# Patient Record
Sex: Female | Born: 1960
Health system: Southern US, Community
[De-identification: ages and names within clinical notes are randomized; demographics above are authoritative.]

## PROBLEM LIST (undated history)

## (undated) DIAGNOSIS — Z9109 Other allergy status, other than to drugs and biological substances: Secondary | ICD-10-CM

## (undated) DIAGNOSIS — K589 Irritable bowel syndrome without diarrhea: Secondary | ICD-10-CM

## (undated) DIAGNOSIS — Z9889 Other specified postprocedural states: Secondary | ICD-10-CM

## (undated) DIAGNOSIS — N3010 Interstitial cystitis (chronic) without hematuria: Secondary | ICD-10-CM

## (undated) DIAGNOSIS — I372 Nonrheumatic pulmonary valve stenosis with insufficiency: Secondary | ICD-10-CM

## (undated) DIAGNOSIS — R112 Nausea with vomiting, unspecified: Secondary | ICD-10-CM

## (undated) DIAGNOSIS — K219 Gastro-esophageal reflux disease without esophagitis: Secondary | ICD-10-CM

## (undated) DIAGNOSIS — G35 Multiple sclerosis: Secondary | ICD-10-CM

## (undated) DIAGNOSIS — T7840XA Allergy, unspecified, initial encounter: Secondary | ICD-10-CM

## (undated) DIAGNOSIS — E785 Hyperlipidemia, unspecified: Secondary | ICD-10-CM

## (undated) DIAGNOSIS — G709 Myoneural disorder, unspecified: Secondary | ICD-10-CM

## (undated) DIAGNOSIS — M7070 Other bursitis of hip, unspecified hip: Secondary | ICD-10-CM

## (undated) DIAGNOSIS — M1990 Unspecified osteoarthritis, unspecified site: Secondary | ICD-10-CM

## (undated) HISTORY — PX: BREAST SURGERY: SHX581

## (undated) HISTORY — PX: UPPER GASTROINTESTINAL ENDOSCOPY: SHX188

## (undated) HISTORY — DX: Unspecified osteoarthritis, unspecified site: M19.90

## (undated) HISTORY — DX: Gastro-esophageal reflux disease without esophagitis: K21.9

## (undated) HISTORY — PX: AUGMENTATION MAMMAPLASTY: SUR837

## (undated) HISTORY — DX: Other bursitis of hip, unspecified hip: M70.70

## (undated) HISTORY — PX: LIPOSUCTION MULTIPLE BODY PARTS: SUR832

## (undated) HISTORY — DX: Allergy, unspecified, initial encounter: T78.40XA

## (undated) HISTORY — PX: ABDOMINAL HYSTERECTOMY: SHX81

## (undated) HISTORY — DX: Nausea with vomiting, unspecified: Z98.890

## (undated) HISTORY — DX: Nonrheumatic pulmonary valve stenosis with insufficiency: I37.2

## (undated) HISTORY — PX: POLYPECTOMY: SHX149

## (undated) HISTORY — PX: OTHER SURGICAL HISTORY: SHX169

## (undated) HISTORY — PX: COLONOSCOPY: SHX174

## (undated) HISTORY — PX: RHINOPLASTY: SUR1284

## (undated) HISTORY — PX: WISDOM TOOTH EXTRACTION: SHX21

## (undated) HISTORY — DX: Nausea with vomiting, unspecified: R11.2

## (undated) HISTORY — PX: TUBAL LIGATION: SHX77

---

## 2002-03-20 ENCOUNTER — Other Ambulatory Visit: Admission: RE | Admit: 2002-03-20 | Discharge: 2002-03-20 | Payer: Self-pay | Admitting: Family Medicine

## 2002-05-29 ENCOUNTER — Ambulatory Visit (HOSPITAL_COMMUNITY): Admission: RE | Admit: 2002-05-29 | Discharge: 2002-05-29 | Payer: Self-pay | Admitting: Neurology

## 2002-05-29 ENCOUNTER — Encounter: Payer: Self-pay | Admitting: Neurology

## 2004-07-15 ENCOUNTER — Other Ambulatory Visit: Admission: RE | Admit: 2004-07-15 | Discharge: 2004-07-15 | Payer: Self-pay | Admitting: Family Medicine

## 2006-07-13 ENCOUNTER — Other Ambulatory Visit: Admission: RE | Admit: 2006-07-13 | Discharge: 2006-07-13 | Payer: Self-pay | Admitting: Family Medicine

## 2007-11-21 ENCOUNTER — Other Ambulatory Visit: Admission: RE | Admit: 2007-11-21 | Discharge: 2007-11-21 | Payer: Self-pay | Admitting: Obstetrics and Gynecology

## 2007-12-14 HISTORY — PX: NOVASURE ABLATION: SHX5394

## 2007-12-14 HISTORY — PX: DILATION AND CURETTAGE OF UTERUS: SHX78

## 2007-12-20 ENCOUNTER — Encounter (INDEPENDENT_AMBULATORY_CARE_PROVIDER_SITE_OTHER): Payer: Self-pay | Admitting: Obstetrics and Gynecology

## 2007-12-20 ENCOUNTER — Ambulatory Visit (HOSPITAL_COMMUNITY): Admission: RE | Admit: 2007-12-20 | Discharge: 2007-12-20 | Payer: Self-pay | Admitting: Obstetrics and Gynecology

## 2008-11-30 ENCOUNTER — Encounter: Admission: RE | Admit: 2008-11-30 | Discharge: 2008-11-30 | Payer: Self-pay | Admitting: Family Medicine

## 2008-12-19 ENCOUNTER — Ambulatory Visit: Payer: Self-pay | Admitting: Gastroenterology

## 2009-01-02 ENCOUNTER — Ambulatory Visit: Payer: Self-pay | Admitting: Gastroenterology

## 2009-01-02 ENCOUNTER — Encounter: Payer: Self-pay | Admitting: Gastroenterology

## 2009-01-03 ENCOUNTER — Encounter: Payer: Self-pay | Admitting: Gastroenterology

## 2009-04-23 ENCOUNTER — Other Ambulatory Visit: Admission: RE | Admit: 2009-04-23 | Discharge: 2009-04-23 | Payer: Self-pay | Admitting: Obstetrics and Gynecology

## 2009-05-10 ENCOUNTER — Encounter: Admission: RE | Admit: 2009-05-10 | Discharge: 2009-05-10 | Payer: Self-pay | Admitting: Neurology

## 2010-04-02 ENCOUNTER — Encounter: Admission: RE | Admit: 2010-04-02 | Discharge: 2010-04-02 | Payer: Self-pay | Admitting: Family Medicine

## 2010-04-29 ENCOUNTER — Emergency Department (HOSPITAL_COMMUNITY): Admission: EM | Admit: 2010-04-29 | Discharge: 2010-04-29 | Payer: Self-pay | Admitting: Emergency Medicine

## 2010-05-02 ENCOUNTER — Emergency Department (HOSPITAL_COMMUNITY): Admission: EM | Admit: 2010-05-02 | Discharge: 2010-05-02 | Payer: Self-pay | Admitting: Family Medicine

## 2010-05-06 ENCOUNTER — Emergency Department (HOSPITAL_COMMUNITY): Admission: EM | Admit: 2010-05-06 | Discharge: 2010-05-06 | Payer: Self-pay | Admitting: Family Medicine

## 2010-05-13 ENCOUNTER — Emergency Department (HOSPITAL_COMMUNITY): Admission: EM | Admit: 2010-05-13 | Discharge: 2010-05-13 | Payer: Self-pay | Admitting: Family Medicine

## 2010-07-21 ENCOUNTER — Other Ambulatory Visit: Admission: RE | Admit: 2010-07-21 | Discharge: 2010-07-21 | Payer: Self-pay | Admitting: Family Medicine

## 2010-07-28 ENCOUNTER — Encounter: Admission: RE | Admit: 2010-07-28 | Discharge: 2010-07-28 | Payer: Self-pay | Admitting: Family Medicine

## 2010-09-25 ENCOUNTER — Encounter
Admission: RE | Admit: 2010-09-25 | Discharge: 2010-09-25 | Payer: Self-pay | Source: Home / Self Care | Attending: Orthopaedic Surgery | Admitting: Orthopaedic Surgery

## 2011-01-27 NOTE — Op Note (Signed)
Amber Singleton, Amber Singleton              ACCOUNT NO.:  [id number]   MEDICAL RECORD NO.:  [id number]          PATIENT TYPE:  AMB   LOCATION:  SDC                           FACILITY:  [facility]   PHYSICIAN:  Gerald Leitz, MD          DATE OF BIRTH:  Feb 02, 1961   DATE OF PROCEDURE:  12/20/2007  DATE OF DISCHARGE:                               OPERATIVE REPORT   PREOPERATIVE DIAGNOSIS:  Menorrhagia.   POSTOPERATIVE DIAGNOSIS:  Menorrhagia.  Endometrial polyp.   PROCEDURE:  Hysteroscopy, dilation and curettage, NovaSure endometrial  ablation.   SURGEON:  Gerald Leitz, MD   ASSISTANT:  None.   ANESTHESIA:  General.   FINDINGS:  Small endometrial polyp.   SPECIMEN:  Endometrial curettings.   DISPOSITION OF SPECIMEN:  Sent to pathology.   ESTIMATED BLOOD LOSS:  Minimal.   COMPLICATIONS:  None.   PROCEDURE:  The patient was taken to the operating room where she was  placed under general anesthesia.  She was placed in dorsal lithotomy  position, prepped and draped in the usual sterile fashion.  Bivalve  speculum was placed into the vaginal vault.  The anterior lip of the  cervix was grasped with a single-tooth tenaculum.  10 mL of 0.25%  Marcaine were injected at the 5 and 7 o'clock position of the cervix.  The uterus sounded to 9 cm.  Endocervical length was 3.5 cm for cavity  length of 5.5 cm. Hysteroscope was inserted and the cavity was noted to  have a small endometrial polyp, otherwise appeared normal. Hysteroscope  was removed.  The cervix was dilated to 8 mm.  Sharp curettage was  performed until a gritty texture was noted all the way around.  The  NovaSure apparatus was introduced into the cervix and uterine cavity  until the fundus was reached and the array was deployed.  The apparatus  was seated.  The cavity width was noted to be 4.7 cm.  The CO2 cavity  assessment test was performed. The NovaSure apparatus did not pass the  cavity assessment test. The NovaSure apparatus was removed.  Hysteroscope was then reinserted to assess for perforations.  No  perforations were noted.  NovaSure apparatus was reintroduced. The array  was deployed.  Cavity assessment test failed again.  The NovaSure was  removed from the uterus.  A new NovaSure apparatus was introduced to a  depth 9 cm.  The array was deployed. Cavity length was set at 5.5 cm.  The cavity width was 4.7 cm.  Cavity assessment test was performed and  this was passed.  The ablation was performed at a power of 142 for a  total of 1 minute 25 seconds.  NovaSure apparatus was removed from the  uterus.  The hysteroscope was reinserted.  The cavity was assessed.  No  evidence of perforation was noted.  The hysteroscope was removed.  The  single-tooth tenaculum was removed from the anterior lip of the cervix.  Bivalve  speculum was removed from the vagina.  Excellent hemostasis was noted.  The patient was awakened from anesthesia, taken to recovery  room awake  and in stable condition.  Please note that the distention medium deficit  was 70 mL.      Gerald Leitz, MD  Electronically Signed     TC/MEDQ  D:  12/20/2007  T:  12/20/2007  Job:  [id number]

## 2011-01-27 NOTE — H&P (Signed)
NAMEJAQUITA, Amber Singleton              ACCOUNT NO.:  [id number]   MEDICAL RECORD NO.:  [id number]          PATIENT TYPE:  AMB   LOCATION:  SDC                           [facility]ACILITY:  [facility]   PHYSICIAN:  Gerald Leitz, MD          DATE OF BIRTH:  October 30, 1960   DATE OF ADMISSION:  DATE OF DISCHARGE:                              HISTORY & PHYSICAL   The patient scheduled for surgery on December 20, 2007.   HISTORY OF PRESENT ILLNESS:  This is a 50 year old G2, P2 with  menorrhagia, who desires treatment with endometrial ablation.  She had  an endometrial biopsy performed on November 29, 2007 that was benign.  Ultrasound performed on November 29, 2007 shows that uterus measured 10.3 x  6.0 x 6.4 cm, with three small uterine fibroids noted, each less than 2-  cm.  The endometrium was thickened with a questionable hyperechoic mass  1.5 x 0.7 cm.  The patient's last menstrual period was November 24, 2007,  lasting 7 days.  She saturates a super tampon approximately every hour,  on her heaviest day.   PAST OBSTETRIC HISTORY:  Cesarean section x2.   GYNECOLOGICAL HISTORY:  Menarche at the age of 50.  Contraception:  Tubal  ligation.  Last Pap smear was November 21, 2007, and this was normal   PAST MEDICAL HISTORY:  Irritable bowel syndrome, interstitial cystitis  and multiple sclerosis.   PAST SURGICAL HISTORY:  Cesarean section x2, tubal ligation, breast  implantations, liposuction approximately 8 years ago, no surgery greater  than 25 years ago.   MEDICATIONS:  Benadryl as needed.   ALLERGIES:  PENICILLIN.   SOCIAL HISTORY:  The patient is married.  She denies tobacco use.  Occasional alcohol use.  No illicit drug use.   FAMILY HISTORY:  Mother with breast cancer.   REVIEW OF SYSTEMS:  Negative except as stated in history of current  illness.   PHYSICAL EXAM:  VITAL SIGNS:  Blood pressure 126/82, height 65-3/4  inches, weight 147 pounds.  CARDIOVASCULAR:  Regular rate and rhythm.  LUNGS:  Clear to  auscultation bilaterally.  ABDOMEN:  Soft, nontender, nondistended.  EXTREMITIES:  No clubbing, cyanosis or edema.  PELVIC:  Normal external female genitalia.  No vulvar or vaginal  cervical lesions noted.  BIMANUAL:  Exam reveals approximately a 10 to 12-week uterus.  No  adnexal masses or tenderness.   IMPRESSION AND PLAN:  A 50 year old with menorrhagia and small uterine  fibroids, questionable endometrial mass, desires therapy with  hysteroscopy D&C and endometrial ablation.  Risks, benefits,  alternatives of the surgery  was discussed with the patient, including but not limited to infection,  bleeding, possible uterine perforation, with the need for further  surgery.  The patient was understanding of risks and accepts these and  desires to proceed with hysteroscopy D&C and endometrial ablation.      Gerald Leitz, MD  Electronically Signed     TC/MEDQ  D:  12/19/2007  T:  12/19/2007  Job:  [id number]

## 2011-04-08 ENCOUNTER — Other Ambulatory Visit: Payer: Self-pay | Admitting: Family Medicine

## 2011-04-08 DIAGNOSIS — Z1231 Encounter for screening mammogram for malignant neoplasm of breast: Secondary | ICD-10-CM

## 2011-04-16 ENCOUNTER — Ambulatory Visit: Payer: Self-pay

## 2011-04-20 ENCOUNTER — Other Ambulatory Visit: Payer: Self-pay | Admitting: Family Medicine

## 2011-04-20 ENCOUNTER — Ambulatory Visit
Admission: RE | Admit: 2011-04-20 | Discharge: 2011-04-20 | Disposition: A | Discharge status: Home / Self Care | Payer: BC Managed Care – PPO | Source: Ambulatory Visit | Attending: Family Medicine | Admitting: Family Medicine

## 2011-04-20 DIAGNOSIS — Z1231 Encounter for screening mammogram for malignant neoplasm of breast: Secondary | ICD-10-CM

## 2011-06-09 LAB — CBC
HCT: 39.7
Hemoglobin: 13.6
RBC: 4.66
RDW: 15.5
WBC: 6.3

## 2011-06-09 LAB — URINALYSIS, ROUTINE W REFLEX MICROSCOPIC
Protein, ur: NEGATIVE
Specific Gravity, Urine: 1.01
Urobilinogen, UA: 0.2

## 2011-06-09 LAB — BASIC METABOLIC PANEL
CO2: 26
Chloride: 104
GFR calc Af Amer: >60
GFR calc non Af Amer: >60
Potassium: 3.6
Sodium: 137

## 2011-06-09 LAB — DIFFERENTIAL
Basophils Absolute: 0
Basophils Relative: 1
Lymphocytes Relative: 31
Monocytes Relative: 8
Neutrophils Relative %: 60

## 2011-06-09 LAB — URINE MICROSCOPIC-ADD ON

## 2011-10-06 ENCOUNTER — Encounter (HOSPITAL_COMMUNITY): Payer: Self-pay

## 2011-10-13 ENCOUNTER — Other Ambulatory Visit: Payer: Self-pay | Admitting: Obstetrics and Gynecology

## 2011-10-16 ENCOUNTER — Encounter (HOSPITAL_COMMUNITY)
Admission: RE | Admit: 2011-10-16 | Discharge: 2011-10-16 | Disposition: A | Discharge status: Home / Self Care | Payer: BC Managed Care – PPO | Source: Ambulatory Visit | Attending: Obstetrics and Gynecology | Admitting: Obstetrics and Gynecology

## 2011-10-16 ENCOUNTER — Encounter (HOSPITAL_COMMUNITY): Payer: Self-pay

## 2011-10-16 HISTORY — DX: Myoneural disorder, unspecified: G70.9

## 2011-10-16 HISTORY — DX: Irritable bowel syndrome, unspecified: K58.9

## 2011-10-16 HISTORY — DX: Hyperlipidemia, unspecified: E78.5

## 2011-10-16 HISTORY — DX: Multiple sclerosis: G35

## 2011-10-16 HISTORY — DX: Interstitial cystitis (chronic) without hematuria: N30.10

## 2011-10-16 HISTORY — DX: Other allergy status, other than to drugs and biological substances: Z91.09

## 2011-10-16 LAB — CBC
MCHC: 34.9 g/dL (ref 30.0–36.0)
Platelets: 237 10*3/uL (ref 150–400)
RDW: 12.7 % (ref 11.5–15.5)
WBC: 7.5 10*3/uL (ref 4.0–10.5)

## 2011-10-16 LAB — SURGICAL PCR SCREEN
MRSA, PCR: NEGATIVE
Staphylococcus aureus: NEGATIVE

## 2011-10-16 NOTE — Patient Instructions (Addendum)
   Your procedure is scheduled on: Friday, Feb 8th  Enter through the Main Entrance of [facility] at: 1130am Pick up the phone at the desk and dial [phone] and inform us of your arrival.  Please call this number if you have any problems the morning of surgery: [phone]  Remember: Do not eat food after midnight: Thursday Do not drink clear liquids after:  Thursday Take these medicines the morning of surgery with a SIP OF WATER: None  Do not wear jewelry, make-up, or FINGER nail polish Do not wear lotions, powders, perfumes or deodorant. Do not shave 48 hours prior to surgery. Do not bring valuables to the hospital.  Leave suitcase in the car. After Surgery it may be brought to your room. For patients being admitted to the hospital, checkout time is 11:00am the day of discharge. Home with Husband Cassondra Stachowski  cell [phone]  Patients discharged on the day of surgery will not be allowed to drive home.    Remember to use your hibiclens as instructed.Please shower with 1/2 bottle the evening before your surgery and the other 1/2 bottle the morning of surgery.

## 2011-10-22 MED ORDER — GENTAMICIN SULFATE 40 MG/ML IJ SOLN
INTRAVENOUS | Status: AC
  Administered 2011-10-23: 100 mL via INTRAVENOUS
  Filled 2011-10-22: qty 2.5

## 2011-10-22 NOTE — H&P (Signed)
Amber Singleton, Amber Singleton              ACCOUNT NO.:  [id number]  MEDICAL RECORD NO.:  [id number]  LOCATION:  PERIO                         FACILITY:  [facility]  PHYSICIAN:  Lenoard Aden, M.D.DATE OF BIRTH:  Oct 18, 1960  DATE OF ADMISSION:  09/02/2011 DATE OF DISCHARGE:                             HISTORY & PHYSICAL   CHIEF COMPLAINT:  Dysmenorrhea, pelvic pain with a history of failed endometrial ablation, symptomatic fibroids for definitive therapy.  HISTORY OF PRESENT ILLNESS:  She is a 51 year old white female, G2, P2, history of C-section x2, tubal ligation, who presents now for definitive treatment of dysmenorrhea, failed endometrial ablation, and symptomatic fibroids for definitive therapy.  ALLERGIES:  She has no known latex allergy.  She has drug allergy to PENICILLIN.  MEDICATIONS:  Ibuprofen as needed, and Gralise.  SURGICAL HISTORY:  Remarkable for tubal ligation, NovaSure ablation in 2009, D and C 2009, breast augmentation in 1996, tubal ligation in 1991, C-section x2.  FAMILY HISTORY:  Remarkable for mother with breast cancer, diabetes, heart disease, and lymphoma.  SOCIAL HISTORY:  She is a nonsmoker, nondrinker.  She denies domestic or physical violence.  MEDICAL HISTORY:  Also remarkable for history of MS and a history of Lyme disease.  PHYSICAL EXAMINATION:  GENERAL:  She is a well-developed, well-nourished white female. VITAL SIGNS:  Weight of 134 pounds, height of 54-1/2 inches. HEENT:  Normal. NECK:  Supple.  Full range of motion. LUNGS:  Clear. HEART:  Regular rate and rhythm. ABDOMEN:  Soft.  Uterus is bulky, irregular, and nontender to palpation. EXTREMITIES:  There are no cords. NEUROLOGIC:  Nonfocal. SKIN:  Intact.  IMPRESSION: 1. History of menorrhagia with failed endometrial ablation. 2. Previous cesarean section x2. 3. Symptomatic fibroids. 4. Strong family history of breast cancer in a first-degree relative.  PLAN:  Proceed with da Vinci  assisted total laparoscopic hysterectomy, BSO.  Risks of anesthesia, infection, bleeding, injury to abdominal organs, need for repair was discussed.  Delayed versus immediate complications to include bowel and bladder injury noted.  The patient acknowledges and wishes to proceed.     Lenoard Aden, M.D.     RJT/MEDQ  D:  10/22/2011  T:  10/22/2011  Job:  [id number]

## 2011-10-23 ENCOUNTER — Ambulatory Visit (HOSPITAL_COMMUNITY): Payer: BC Managed Care – PPO | Admitting: Anesthesiology

## 2011-10-23 ENCOUNTER — Encounter (HOSPITAL_COMMUNITY): Payer: Self-pay

## 2011-10-23 ENCOUNTER — Other Ambulatory Visit: Payer: Self-pay | Admitting: Obstetrics and Gynecology

## 2011-10-23 ENCOUNTER — Encounter (HOSPITAL_COMMUNITY): Payer: Self-pay | Admitting: Anesthesiology

## 2011-10-23 ENCOUNTER — Ambulatory Visit (HOSPITAL_COMMUNITY)
Admission: RE | Admit: 2011-10-23 | Discharge: 2011-10-24 | Disposition: A | Discharge status: Home / Self Care | Payer: BC Managed Care – PPO | Source: Ambulatory Visit | Attending: Obstetrics and Gynecology | Admitting: Obstetrics and Gynecology

## 2011-10-23 ENCOUNTER — Encounter (HOSPITAL_COMMUNITY)
Admission: RE | Disposition: A | Discharge status: Home / Self Care | Payer: Self-pay | Source: Ambulatory Visit | Attending: Obstetrics and Gynecology

## 2011-10-23 DIAGNOSIS — N801 Endometriosis of ovary: Secondary | ICD-10-CM | POA: Insufficient documentation

## 2011-10-23 DIAGNOSIS — N946 Dysmenorrhea, unspecified: Secondary | ICD-10-CM | POA: Insufficient documentation

## 2011-10-23 DIAGNOSIS — N838 Other noninflammatory disorders of ovary, fallopian tube and broad ligament: Secondary | ICD-10-CM | POA: Insufficient documentation

## 2011-10-23 DIAGNOSIS — N949 Unspecified condition associated with female genital organs and menstrual cycle: Secondary | ICD-10-CM | POA: Insufficient documentation

## 2011-10-23 DIAGNOSIS — N920 Excessive and frequent menstruation with regular cycle: Secondary | ICD-10-CM | POA: Insufficient documentation

## 2011-10-23 DIAGNOSIS — R1031 Right lower quadrant pain: Secondary | ICD-10-CM | POA: Insufficient documentation

## 2011-10-23 DIAGNOSIS — N80109 Endometriosis of ovary, unspecified side, unspecified depth: Secondary | ICD-10-CM | POA: Insufficient documentation

## 2011-10-23 DIAGNOSIS — Z90710 Acquired absence of both cervix and uterus: Secondary | ICD-10-CM | POA: Diagnosis not present

## 2011-10-23 SURGERY — ROBOTIC ASSISTED TOTAL HYSTERECTOMY
Anesthesia: General | Site: Abdomen | Wound class: Clean Contaminated

## 2011-10-23 MED ORDER — FENTANYL CITRATE 0.05 MG/ML IJ SOLN
INTRAMUSCULAR | Status: AC
  Filled 2011-10-23: qty 5

## 2011-10-23 MED ORDER — MIDAZOLAM HCL 2 MG/2ML IJ SOLN
INTRAMUSCULAR | Status: AC
  Filled 2011-10-23: qty 2

## 2011-10-23 MED ORDER — TRAMADOL HCL 50 MG PO TABS: 50.0000 mg | ORAL_TABLET | Freq: Four times a day (QID) | ORAL | Status: DC | PRN

## 2011-10-23 MED ORDER — HYDROMORPHONE 0.3 MG/ML IV SOLN
INTRAVENOUS | Status: AC
  Administered 2011-10-23: 7.5 mg via INTRAVENOUS
  Filled 2011-10-23: qty 25

## 2011-10-23 MED ORDER — DEXAMETHASONE SODIUM PHOSPHATE 4 MG/ML IJ SOLN
INTRAMUSCULAR | Status: DC | PRN
  Administered 2011-10-23: 10 mg via INTRAVENOUS

## 2011-10-23 MED ORDER — ONDANSETRON HCL 4 MG/2ML IJ SOLN
INTRAMUSCULAR | Status: DC | PRN
  Administered 2011-10-23: 4 mg via INTRAVENOUS

## 2011-10-23 MED ORDER — HYDROMORPHONE 0.3 MG/ML IV SOLN
INTRAVENOUS | Status: DC
  Administered 2011-10-23: 2.67 mg via INTRAVENOUS
  Administered 2011-10-23: 7.5 mg via INTRAVENOUS
  Administered 2011-10-24 (×2): 1.33 mg via INTRAVENOUS
  Administered 2011-10-24: 0.3 mg via INTRAVENOUS

## 2011-10-23 MED ORDER — SCOPOLAMINE 1 MG/3DAYS TD PT72
1.0000 | MEDICATED_PATCH | Freq: Once | TRANSDERMAL | Status: DC
  Administered 2011-10-23: 1.5 mg via TRANSDERMAL

## 2011-10-23 MED ORDER — DIPHENHYDRAMINE HCL 12.5 MG/5ML PO ELIX
12.5000 mg | ORAL_SOLUTION | Freq: Four times a day (QID) | ORAL | Status: DC | PRN
  Filled 2011-10-23: qty 5

## 2011-10-23 MED ORDER — SODIUM CHLORIDE 0.9 % IJ SOLN: 9.0000 mL | INTRAMUSCULAR | Status: DC | PRN

## 2011-10-23 MED ORDER — ONDANSETRON HCL 4 MG/2ML IJ SOLN: 4.0000 mg | Freq: Four times a day (QID) | INTRAMUSCULAR | Status: DC | PRN

## 2011-10-23 MED ORDER — FENTANYL CITRATE 0.05 MG/ML IJ SOLN
50.0000 ug | INTRAMUSCULAR | Status: AC | PRN
  Administered 2011-10-23 (×2): 50 ug via INTRAVENOUS

## 2011-10-23 MED ORDER — KETOROLAC TROMETHAMINE 30 MG/ML IJ SOLN: 15.0000 mg | Freq: Once | INTRAMUSCULAR | Status: DC | PRN

## 2011-10-23 MED ORDER — LIDOCAINE HCL (CARDIAC) 20 MG/ML IV SOLN
INTRAVENOUS | Status: DC | PRN
  Administered 2011-10-23: 50 mg via INTRAVENOUS

## 2011-10-23 MED ORDER — NALOXONE HCL 0.4 MG/ML IJ SOLN: 0.4000 mg | INTRAMUSCULAR | Status: DC | PRN

## 2011-10-23 MED ORDER — ROCURONIUM BROMIDE 50 MG/5ML IV SOLN
INTRAVENOUS | Status: AC
  Filled 2011-10-23: qty 1

## 2011-10-23 MED ORDER — OXYCODONE-ACETAMINOPHEN 5-325 MG PO TABS: 1.0000 | ORAL_TABLET | ORAL | Status: DC | PRN

## 2011-10-23 MED ORDER — DEXAMETHASONE SODIUM PHOSPHATE 10 MG/ML IJ SOLN
INTRAMUSCULAR | Status: AC
  Filled 2011-10-23: qty 1

## 2011-10-23 MED ORDER — LACTATED RINGERS IR SOLN
Status: DC | PRN
  Administered 2011-10-23: 3000 mL

## 2011-10-23 MED ORDER — FENTANYL CITRATE 0.05 MG/ML IJ SOLN
INTRAMUSCULAR | Status: AC
  Administered 2011-10-23: 50 ug via INTRAVENOUS
  Filled 2011-10-23: qty 2

## 2011-10-23 MED ORDER — PROMETHAZINE HCL 25 MG/ML IJ SOLN
INTRAMUSCULAR | Status: AC
  Filled 2011-10-23: qty 1

## 2011-10-23 MED ORDER — GLYCOPYRROLATE 0.2 MG/ML IJ SOLN
INTRAMUSCULAR | Status: DC | PRN
  Administered 2011-10-23: .4 mg via INTRAVENOUS
  Administered 2011-10-23: 0.2 mg via INTRAVENOUS

## 2011-10-23 MED ORDER — ZOLPIDEM TARTRATE 5 MG PO TABS: 5.0000 mg | ORAL_TABLET | Freq: Every evening | ORAL | Status: DC | PRN

## 2011-10-23 MED ORDER — PROMETHAZINE HCL 25 MG/ML IJ SOLN
6.2500 mg | INTRAMUSCULAR | Status: DC | PRN
  Administered 2011-10-23: 6.25 mg via INTRAVENOUS

## 2011-10-23 MED ORDER — PROPOFOL 10 MG/ML IV EMUL
INTRAVENOUS | Status: DC | PRN
  Administered 2011-10-23: 150 mg via INTRAVENOUS

## 2011-10-23 MED ORDER — ROCURONIUM BROMIDE 100 MG/10ML IV SOLN
INTRAVENOUS | Status: DC | PRN
  Administered 2011-10-23: 20 mg via INTRAVENOUS
  Administered 2011-10-23: 50 mg via INTRAVENOUS

## 2011-10-23 MED ORDER — PROPOFOL 10 MG/ML IV EMUL
INTRAVENOUS | Status: AC
  Filled 2011-10-23: qty 20

## 2011-10-23 MED ORDER — GLYCOPYRROLATE 0.2 MG/ML IJ SOLN
INTRAMUSCULAR | Status: AC
  Filled 2011-10-23: qty 1

## 2011-10-23 MED ORDER — LIDOCAINE HCL (CARDIAC) 20 MG/ML IV SOLN
INTRAVENOUS | Status: AC
  Filled 2011-10-23: qty 5

## 2011-10-23 MED ORDER — MEPERIDINE HCL 25 MG/ML IJ SOLN: 6.2500 mg | INTRAMUSCULAR | Status: DC | PRN

## 2011-10-23 MED ORDER — DEXTROSE-NACL 5-0.45 % IV SOLN
INTRAVENOUS | Status: DC
  Administered 2011-10-23 – 2011-10-24 (×2): via INTRAVENOUS

## 2011-10-23 MED ORDER — NEOSTIGMINE METHYLSULFATE 1 MG/ML IJ SOLN
INTRAMUSCULAR | Status: DC | PRN
  Administered 2011-10-23: 2 mg via INTRAVENOUS

## 2011-10-23 MED ORDER — FENTANYL CITRATE 0.05 MG/ML IJ SOLN
25.0000 ug | INTRAMUSCULAR | Status: DC | PRN
  Administered 2011-10-23 (×2): 25 ug via INTRAVENOUS

## 2011-10-23 MED ORDER — FENTANYL CITRATE 0.05 MG/ML IJ SOLN
INTRAMUSCULAR | Status: DC | PRN
  Administered 2011-10-23: 150 ug via INTRAVENOUS
  Administered 2011-10-23: 100 ug via INTRAVENOUS

## 2011-10-23 MED ORDER — BUPIVACAINE HCL (PF) 0.25 % IJ SOLN
INTRAMUSCULAR | Status: DC | PRN
  Administered 2011-10-23: 12 mL

## 2011-10-23 MED ORDER — KETOROLAC TROMETHAMINE 30 MG/ML IJ SOLN
INTRAMUSCULAR | Status: DC | PRN
  Administered 2011-10-23: 30 mg via INTRAMUSCULAR
  Administered 2011-10-23: 30 mg via INTRAVENOUS

## 2011-10-23 MED ORDER — NEOSTIGMINE METHYLSULFATE 1 MG/ML IJ SOLN
INTRAMUSCULAR | Status: AC
  Filled 2011-10-23: qty 10

## 2011-10-23 MED ORDER — BUPIVACAINE HCL (PF) 0.25 % IJ SOLN
INTRAMUSCULAR | Status: AC
  Filled 2011-10-23: qty 30

## 2011-10-23 MED ORDER — LACTATED RINGERS IV SOLN
INTRAVENOUS | Status: DC
  Administered 2011-10-23: 125 mL/h via INTRAVENOUS
  Administered 2011-10-23 (×2): via INTRAVENOUS

## 2011-10-23 MED ORDER — SCOPOLAMINE 1 MG/3DAYS TD PT72
MEDICATED_PATCH | TRANSDERMAL | Status: AC
  Administered 2011-10-23: 1.5 mg via TRANSDERMAL
  Filled 2011-10-23: qty 1

## 2011-10-23 MED ORDER — ONDANSETRON HCL 4 MG/2ML IJ SOLN
INTRAMUSCULAR | Status: AC
  Filled 2011-10-23: qty 2

## 2011-10-23 MED ORDER — FENTANYL CITRATE 0.05 MG/ML IJ SOLN
INTRAMUSCULAR | Status: AC
  Filled 2011-10-23: qty 2

## 2011-10-23 MED ORDER — DIPHENHYDRAMINE HCL 50 MG/ML IJ SOLN: 12.5000 mg | Freq: Four times a day (QID) | INTRAMUSCULAR | Status: DC | PRN

## 2011-10-23 MED ORDER — MIDAZOLAM HCL 5 MG/5ML IJ SOLN
INTRAMUSCULAR | Status: DC | PRN
  Administered 2011-10-23: 2 mg via INTRAVENOUS

## 2011-10-23 SURGICAL SUPPLY — 71 items
BAG URINE DRAINAGE (UROLOGICAL SUPPLIES) ×4 IMPLANT
BARRIER ADHS 3X4 INTERCEED (GAUZE/BANDAGES/DRESSINGS) IMPLANT
BLADELESS LONG 8MM (BLADE) IMPLANT
CABLE HIGH FREQUENCY MONO STRZ (ELECTRODE) ×4 IMPLANT
CATH FOLEY 3WAY  5CC 16FR (CATHETERS) ×1
CATH FOLEY 3WAY 5CC 16FR (CATHETERS) ×3 IMPLANT
CHLORAPREP W/TINT 26ML (MISCELLANEOUS) ×4 IMPLANT
CLOTH BEACON ORANGE TIMEOUT ST (SAFETY) ×4 IMPLANT
CONT PATH 16OZ SNAP LID 3702 (MISCELLANEOUS) ×4 IMPLANT
COVER MAYO STAND STRL (DRAPES) ×4 IMPLANT
COVER TABLE BACK 60X90 (DRAPES) ×8 IMPLANT
COVER TIP SHEARS 8 DVNC (MISCELLANEOUS) ×3 IMPLANT
COVER TIP SHEARS 8MM DA VINCI (MISCELLANEOUS) ×1
DECANTER SPIKE VIAL GLASS SM (MISCELLANEOUS) ×4 IMPLANT
DERMABOND ADVANCED (GAUZE/BANDAGES/DRESSINGS) ×1
DERMABOND ADVANCED .7 DNX12 (GAUZE/BANDAGES/DRESSINGS) ×3 IMPLANT
DRAPE HUG U DISPOSABLE (DRAPE) ×4 IMPLANT
DRAPE LG THREE QUARTER DISP (DRAPES) ×8 IMPLANT
DRAPE MONITOR DA VINCI (DRAPE) IMPLANT
DRAPE WARM FLUID 44X44 (DRAPE) ×4 IMPLANT
ELECT REM PT RETURN 9FT ADLT (ELECTROSURGICAL) ×4
ELECTRODE REM PT RTRN 9FT ADLT (ELECTROSURGICAL) ×3 IMPLANT
EVACUATOR SMOKE 8.L (FILTER) ×4 IMPLANT
GAUZE VASELINE 3X9 (GAUZE/BANDAGES/DRESSINGS) IMPLANT
GLOVE BIO SURGEON STRL SZ7.5 (GLOVE) ×20 IMPLANT
GLOVE BIOGEL PI IND STRL 6.5 (GLOVE) ×3 IMPLANT
GLOVE BIOGEL PI INDICATOR 6.5 (GLOVE) ×1
GLOVE ECLIPSE 6.0 STRL STRAW (GLOVE) ×4 IMPLANT
GOWN PREVENTION PLUS XLARGE (GOWN DISPOSABLE) ×8 IMPLANT
GOWN STRL REIN XL XLG (GOWN DISPOSABLE) ×28 IMPLANT
GYRUS RUMI II 2.5CM BLUE (DISPOSABLE) ×4
GYRUS RUMI II 3.5CM BLUE (DISPOSABLE)
GYRUS RUMI II 4.0CM BLUE (DISPOSABLE)
KIT ACCESSORY DA VINCI DISP (KITS) ×1
KIT ACCESSORY DVNC DISP (KITS) ×3 IMPLANT
KIT DISP ACCESSORY 4 ARM (KITS) IMPLANT
NEEDLE INSUFFLATION 14GA 120MM (NEEDLE) ×4 IMPLANT
PACK LAVH (CUSTOM PROCEDURE TRAY) ×4 IMPLANT
PAD PREP 24X48 CUFFED NSTRL (MISCELLANEOUS) ×8 IMPLANT
PLUG CATH AND CAP STER (CATHETERS) ×4 IMPLANT
POSITIONER SURGICAL ARM (MISCELLANEOUS) IMPLANT
PROTECTOR NERVE ULNAR (MISCELLANEOUS) ×12 IMPLANT
RUMI II 3.0CM BLUE KOH-EFFICIE (DISPOSABLE) IMPLANT
RUMI II GYRUS 2.5CM BLUE (DISPOSABLE) ×3 IMPLANT
RUMI II GYRUS 3.5CM BLUE (DISPOSABLE) IMPLANT
RUMI II GYRUS 4.0CM BLUE (DISPOSABLE) IMPLANT
SET IRRIG TUBING LAPAROSCOPIC (IRRIGATION / IRRIGATOR) ×4 IMPLANT
SOLUTION ELECTROLUBE (MISCELLANEOUS) ×4 IMPLANT
SPONGE LAP 18X18 X RAY DECT (DISPOSABLE) IMPLANT
SUT VIC AB 0 CT1 27 (SUTURE) ×2
SUT VIC AB 0 CT1 27XBRD ANBCTR (SUTURE) ×6 IMPLANT
SUT VIC AB 0 CT1 27XBRD ANTBC (SUTURE) IMPLANT
SUT VICRYL 0 UR6 27IN ABS (SUTURE) ×4 IMPLANT
SUT VICRYL RAPIDE 4/0 PS 2 (SUTURE) ×8 IMPLANT
SYR 50ML LL SCALE MARK (SYRINGE) ×4 IMPLANT
SYRINGE 10CC LL (SYRINGE) ×4 IMPLANT
SYSTEM CONVERTIBLE TROCAR (TROCAR) IMPLANT
TIP UTERINE 5.1X6CM LAV DISP (MISCELLANEOUS) IMPLANT
TIP UTERINE 6.7X10CM GRN DISP (MISCELLANEOUS) ×4 IMPLANT
TIP UTERINE 6.7X6CM WHT DISP (MISCELLANEOUS) IMPLANT
TIP UTERINE 6.7X8CM BLUE DISP (MISCELLANEOUS) IMPLANT
TOWEL OR 17X24 6PK STRL BLUE (TOWEL DISPOSABLE) ×12 IMPLANT
TROCAR DISP BLADELESS 8 DVNC (TROCAR) ×6 IMPLANT
TROCAR DISP BLADELESS 8MM (TROCAR) ×2
TROCAR XCEL 12X100 BLDLESS (ENDOMECHANICALS) IMPLANT
TROCAR XCEL NON-BLD 5MMX100MML (ENDOMECHANICALS) ×4 IMPLANT
TROCAR Z-THREAD 12X150 (TROCAR) ×4 IMPLANT
TROCAR Z-THREAD FIOS 12X100MM (TROCAR) IMPLANT
TUBING FILTER THERMOFLATOR (ELECTROSURGICAL) ×4 IMPLANT
WARMER LAPAROSCOPE (MISCELLANEOUS) ×4 IMPLANT
WATER STERILE IRR 1000ML POUR (IV SOLUTION) ×12 IMPLANT

## 2011-10-23 NOTE — Progress Notes (Signed)
Patient ID: Amber Singleton, female   DOB: 05/13/1961, 51 y.o.   MRN: [id number] Patient seen and examined. Consent witnessed and signed. No changes noted. Update completed.

## 2011-10-23 NOTE — Anesthesia Postprocedure Evaluation (Signed)
  Anesthesia Post Note  Patient: Amber Singleton  Procedure(s) Performed:  ROBOTIC ASSISTED TOTAL HYSTERECTOMY; BILATERAL SALPINGECTOMY - Right Oophorectomy  Anesthesia type: GA  Patient location: PACU  Post pain: Pain level controlled  Post assessment: Post-op Vital signs reviewed  Last Vitals:  Filed Vitals:   10/23/11 1820  BP: 99/57  Pulse: 63  Temp: 36.9 C  Resp: 16    Post vital signs: Reviewed  Level of consciousness: sedated  Complications: No apparent anesthesia complications

## 2011-10-23 NOTE — Transfer of Care (Signed)
Immediate Anesthesia Transfer of Care Note  Patient: Amber Singleton  Procedure(s) Performed:  ROBOTIC ASSISTED TOTAL HYSTERECTOMY; BILATERAL SALPINGECTOMY - Right Oophorectomy  Patient Location: PACU  Anesthesia Type: General  Level of Consciousness: sedated  Airway & Oxygen Therapy: Patient Spontanous Breathing and Patient connected to nasal cannula oxygen  Post-op Assessment: Report given to PACU RN  Post vital signs: Reviewed and stable  Complications: No apparent anesthesia complications

## 2011-10-23 NOTE — Op Note (Signed)
10/23/2011  3:38 PM  PATIENT:  Amber Singleton  51 y.o. female  PRE-OPERATIVE DIAGNOSIS:  Menorrhagia; Right Lower Quadrant Pain  POST-OPERATIVE DIAGNOSIS:  Menorrhagia; Right Lower Quadrant Pain Right hemosalpinx Right ovarian endometriosis Left paratubal cyst Pelvic adhesions  PROCEDURE:  Procedure(s): ROBOTIC ASSISTED TOTAL HYSTERECTOMY BILATERAL SALPINGECTOMY Right Oopherectomy Lysis of Adhesions Cul-de-plasty  SURGEON:  Surgeon(s): Lenoard Aden, MD Alphonsus Sias. Ernestina Penna, MD  ASSISTANTSErnestina Penna   ANESTHESIA:   local and general  ESTIMATED BLOOD LOSS: * No blood loss amount entered *   DRAINS: Urinary Catheter (Foley)   LOCAL MEDICATIONS USED:  MARCAINE 10CC  SPECIMEN:  Source of Specimen:  uterus, cervix, tubes  and right ovary, paratubal cyst  DISPOSITION OF SPECIMEN:  PATHOLOGY  COUNTS:  YES  DICTATION #: [id number]  PLAN OF CARE: DC in am, 23 hr observation status  PATIENT DISPOSITION:  PACU - hemodynamically stable.

## 2011-10-23 NOTE — Anesthesia Preprocedure Evaluation (Addendum)
Anesthesia Evaluation  Patient identified by MRN, date of birth, ID band Patient awake    Reviewed: Allergy & Precautions, H&P , NPO status , Patient's Chart, lab work & pertinent test results  Airway Mallampati: I TM Distance: >3 FB Neck ROM: full    Dental No notable dental hx. (+) Teeth Intact   Pulmonary neg pulmonary ROS,    Pulmonary exam normal       Cardiovascular neg cardio ROS     Neuro/Psych Negative Psych ROS   GI/Hepatic negative GI ROS, Neg liver ROS,   Endo/Other  Negative Endocrine ROS  Renal/GU negative Renal ROS  Genitourinary negative   Musculoskeletal negative musculoskeletal ROS (+)   Abdominal Normal abdominal exam  (+)   Peds negative pediatric ROS (+)  Hematology negative hematology ROS (+)   Anesthesia Other Findings   Reproductive/Obstetrics negative OB ROS                           Anesthesia Physical Anesthesia Plan  ASA: II  Anesthesia Plan: General   Post-op Pain Management:    Induction: Intravenous  Airway Management Planned: Oral ETT  Additional Equipment:   Intra-op Plan:   Post-operative Plan: Extubation in OR  Informed Consent: I have reviewed the patients History and Physical, chart, labs and discussed the procedure including the risks, benefits and alternatives for the proposed anesthesia with the patient or authorized representative who has indicated his/her understanding and acceptance.   Dental Advisory Given  Plan Discussed with: CRNA  Anesthesia Plan Comments: (1. Patient has a recent viral URI. She is concerned about oral swelling. Lets use a 6.0 ETT.)       Anesthesia Quick Evaluation

## 2011-10-23 NOTE — Anesthesia Procedure Notes (Signed)
Procedure Name: Intubation Date/Time: 10/23/2011 1:22 PM Performed by: Madison Hickman Pre-anesthesia Checklist: Patient identified, Emergency Drugs available, Suction available, Timeout performed and Patient being monitored Patient Re-evaluated:Patient Re-evaluated prior to inductionOxygen Delivery Method: Circle System Utilized Preoxygenation: Pre-oxygenation with 100% oxygen Intubation Type: IV induction Ventilation: Mask ventilation without difficulty Laryngoscope Size: Mac and 3 Grade View: Grade II Tube type: Oral Number of attempts: 2 Airway Equipment and Method: stylet Placement Confirmation: ETT inserted through vocal cords under direct vision,  positive ETCO2 and breath sounds checked- equal and bilateral Secured at: 21 cm Tube secured with: Tape Dental Injury: Teeth and Oropharynx as per pre-operative assessment

## 2011-10-23 NOTE — OR Nursing (Signed)
Procedure " Robotic Assisted Total Laparoscopic Hysterectomy, Right Oophorectomy, Bilateral Salpingectomy"

## 2011-10-24 LAB — CBC
HCT: 38 % (ref 36.0–46.0)
MCV: 92.2 fL (ref 78.0–100.0)
RBC: 4.12 MIL/uL (ref 3.87–5.11)
RDW: 12.7 % (ref 11.5–15.5)
WBC: 12.5 10*3/uL — ABNORMAL HIGH (ref 4.0–10.5)

## 2011-10-24 MED ORDER — OXYCODONE-ACETAMINOPHEN 5-325 MG PO TABS: 1.0000 | ORAL_TABLET | ORAL | Status: AC | PRN

## 2011-10-24 MED ORDER — TRAMADOL HCL 50 MG PO TABS: 100.0000 mg | ORAL_TABLET | Freq: Four times a day (QID) | ORAL | Status: DC | PRN

## 2011-10-24 NOTE — Progress Notes (Signed)
1 Day Post-Op  ROBOTIC ASSISTED TOTAL HYSTERECTOMY; BILATERAL SALPINGECTOMY - Right Oophorectomy  Subjective: Patient reports nausea, incisional pain, tolerating PO, + flatus and no problems voiding.    Objective: I have reviewed patient's vital signs, intake and output and labs.  General: alert, cooperative and appears stated age Resp: clear to auscultation bilaterally and normal percussion bilaterally Cardio: regular rate and rhythm, S1, S2 normal, no murmur, click, rub or gallop GI: soft, non-tender; bowel sounds normal; no masses,  no organomegaly and incision: clean, dry and intact Extremities: extremities normal, atraumatic, no cyanosis or edema Vaginal Bleeding: minimal  Assessment: s/p  ROBOTIC ASSISTED TOTAL HYSTERECTOMY; BILATERAL SALPINGECTOMY - Right Oophorectomy: stable, progressing well and tolerating diet  Plan: Advance diet Encourage ambulation Advance to PO medication Discontinue IV fluids Discharge home   LOS: 1 day    Cavon Nicolls J 10/24/2011, 11:42 AM

## 2011-10-24 NOTE — Op Note (Signed)
NAMEJERONICA, Amber Singleton              ACCOUNT NO.:  [id number]  MEDICAL RECORD NO.:  [id number]  LOCATION:  9304                          FACILITY:  [facility]  PHYSICIAN:  Lenoard Aden, M.D.DATE OF BIRTH:  June 23, 1961  DATE OF PROCEDURE:  10/23/2011 DATE OF DISCHARGE:                              OPERATIVE REPORT   SURGEON:  Lenoard Aden, MD  DESCRIPTION OF PROCEDURE:  After being apprised of risks of anesthesia, infection, bleeding, injury to abdominal organs, need for repair, delayed versus immediate complications to include bowel and bladder injury, possible need for repair, the patient was brought to the operating room where she was administered general anesthetic without complications, prepped and draped in sterile fashion,  RUMI retractor was placed per vagina without difficulty and Foley catheter placed.  At this time, attention was turned to the abdominal portion of the procedure whereby dilute Marcaine solution was placed.  Infraumbilical incision made with a scalpel.  Veress needle was placed, opening pressure -2, 4 L CO2 insufflated without difficulty.  Trocar was placed atraumatically.  Visualization reveals a normal upper abdomen with normal liver, gallbladder bed and in the lower abdomen, there are distinct adhesions to the left bowel mesentery to the left adnexa and the right adnexa and to the pelvic sidewall, and extensive adhesions to the bladder flap to the mid uterine and right and left adnexa.  There are adhesions of the bowel mesentery, also to the left round ligament. The trocar sites were then placed 1 on the right, 2 on the left, two 8 mm, one 5 mm under direct visualization with dilute Marcaine solution placed.  Trocars were placed atraumatically and robot was docked in a standard fashion with the PK forceps and scissors placed.  At this time, attention was turned to the left adnexa where the ureter was identified to be peristalsing normally.  The bowel  adhesions were sharply lysed off the left pelvic sidewall exposing a normal left ovary and previously interrupted left tube on the left paratubal cyst.  The tube was traced out to the fimbriated end.  The cyst and tube were then cauterized along the base of the tube and the tube was detached.  The retroperitoneal space was entered cephalad to the round ligament and the ovarian ligament was cauterized and divided.  The ureter skeletonized on the left, by dissecting along the medial leaf of the peritoneum.  The round ligament was divided, opened, and the bladder flap was then sharply dissected in its adherent fashion off the lower uterine segment and cervical vaginal junction exposing palpably the RUMI cup to the cervical vaginal junction.  At this time, attention was turned to the right side where there were adhesions to the right ovary into the right adnexa. The ureter was identified.  The infundibulopelvic ligament on the right was cauterized and divided.  The retroperitoneal space was entered.  The ureter was skeletonized off the medial leaf of the peritoneum and the tube was also detached from its mesosalpingeal attachments exposing the left adnexa.  The round ligament was then cauterized and divided.  The bladder flap was developed.  The bladder adhesions were lysed sharply. Urine was clear.  The anterior cul-de-sac  was then well exposed and the uterine vessels were bilaterally cauterized and divided.  The RUMI cup palpable to the cuff was then scored circumferentially and the specimen was detached and retracted to the vagina.  There was a small bleeder at the left corner which was identified and then cauterized without difficulty.  The cup was closed using a 0 V-Loc in a continuous running fashion, and a second imbricating layer performing culdoplasty was placed.  Good hemostasis was noted.  Irrigation accomplished.  Bilateral normal ureters were noted.  Urine was clear.  All  instruments were removed from the abdomen.  CO2 was released.  Incisions closed using 0- Vicryl, 4-0 Vicryl, and Dermabond.  Specimen had been removed from the vagina.  Vaginal exam reveals the vagina to be well approximated and intact.  The urine was clear.  The patient tolerated the procedure well, was awakened, and transferred to recovery in good condition.     Lenoard Aden, M.D.     RJT/MEDQ  D:  10/23/2011  T:  10/24/2011  Job:  [id number]

## 2011-10-24 NOTE — Progress Notes (Signed)
D/C instructions reviewed with pt.  Pt. States understanding of same.  No home equipment needed.  Ambulated to car with staff without incident.  D/C'd home with husband.

## 2011-10-28 ENCOUNTER — Emergency Department (HOSPITAL_COMMUNITY)
Admission: EM | Admit: 2011-10-28 | Discharge: 2011-10-28 | Disposition: A | Discharge status: Home / Self Care | Payer: BC Managed Care – PPO | Attending: Emergency Medicine | Admitting: Emergency Medicine

## 2011-10-28 ENCOUNTER — Emergency Department (HOSPITAL_COMMUNITY): Payer: BC Managed Care – PPO

## 2011-10-28 ENCOUNTER — Encounter (HOSPITAL_COMMUNITY): Payer: Self-pay

## 2011-10-28 DIAGNOSIS — Z90710 Acquired absence of both cervix and uterus: Secondary | ICD-10-CM | POA: Insufficient documentation

## 2011-10-28 DIAGNOSIS — G35 Multiple sclerosis: Secondary | ICD-10-CM | POA: Insufficient documentation

## 2011-10-28 DIAGNOSIS — Z79899 Other long term (current) drug therapy: Secondary | ICD-10-CM | POA: Insufficient documentation

## 2011-10-28 DIAGNOSIS — R1310 Dysphagia, unspecified: Secondary | ICD-10-CM | POA: Insufficient documentation

## 2011-10-28 NOTE — ED Notes (Signed)
Patient stable upon discharge.  

## 2011-10-28 NOTE — ED Notes (Signed)
Patient reports that she had hysterectomy-on Friday and for the past 1 day noticed a lump when swallowing. Reports uncomfortable when drinking and swallowing, different from the sorethroat post-op

## 2011-10-28 NOTE — Discharge Instructions (Signed)
Amber Singleton, you had physical examination and x-rays of the neck to check on you for difficulty swallowing.  You had had endotracheal intubation while you were having surgery 5 days ago.  Fortunately, your examination and x-rays were good.  It is safe to go home.  You should observe this symptom at home.  If it is related to the endotracheal intubation, it should resolve spontaneously.  If your symptom of difficulty swallowing persists, the next test that would be done to check on it would be a barium swallow x-ray.  This type of x-ray is done between 8 A.M. And 5 P.M., times when a radiologist would be here to supervise and read the x-rays.

## 2011-10-28 NOTE — ED Provider Notes (Signed)
History     CSN: [id number]  Arrival date & time 10/28/11  1414   First MD Initiated Contact with Patient 10/28/11 1901      Chief Complaint  Patient presents with  . Dysphagia    (Consider location/radiation/quality/duration/timing/severity/associated sxs/prior treatment) HPI Comments: Patient is a 51 year old woman who had a hysterectomy last Friday, 5 days ago. She endotracheal intubation. Yesterday she developed a feeling of a lump in her throat when swallowing. That persisted today and she called her gynecologist who referred her to the anesthesiologist who advised her that she had had endotracheal intubation for about 3 hours and that it is possible that this feeling was coming from that. However, if she felt difficulty breathing and swallowing and she should have it evaluated in the ED. She therefore came for evaluation.  Patient is a 51 y.o. female presenting with general illness. The history is provided by the patient.  Illness  The current episode started yesterday (Patient had noted his abdomen to further was driving yesterday, 4 days after a hysterectomy 100 when she had had endotracheal intubation.). The onset was gradual. The problem occurs occasionally. The problem has been unchanged. The problem is moderate. The symptoms are relieved by nothing. Pertinent negatives include no fever, no abdominal pain, no nausea, no vomiting, no neck pain, no neck stiffness and no cough. Intake amount: She has some difficulty swallowing. Urine output has been normal. There were no sick contacts. Recently, medical care has been given at another facility and by a specialist (She had a hysterectomy 5 days ago.).    Past Medical History  Diagnosis Date  . IBS (irritable bowel syndrome)   . IC (interstitial cystitis)   . MS (multiple sclerosis)   . Neuromuscular disorder     MS  . Hyperlipidemia     tx with diet  . Environmental allergies     Past Surgical History  Procedure Date  .  Cesarean section     x 2  . Tubal ligation   . Breast surgery     breast augmentation  . Novasure ablation 12/2007  . Dilation and curettage of uterus 12/2007  . Liposuction multiple body parts   . Wisdom tooth extraction   . Upper gastrointestinal endoscopy   . Colonscopy     x 2  . Abdominal hysterectomy     History reviewed. No pertinent family history.  History  Substance Use Topics  . Smoking status: Never Smoker   . Smokeless tobacco: Never Used  . Alcohol Use: Yes     wine 3-4 times per week    OB History    Grav Para Term Preterm Abortions TAB SAB Ect Mult Living                  Review of Systems  Constitutional: Negative.  Negative for fever.  HENT: Negative.  Negative for neck pain.   Eyes: Negative.   Respiratory: Negative for cough.   Cardiovascular: Negative.   Gastrointestinal: Negative for nausea, vomiting and abdominal pain.       Trouble swallowing, felt at the level of the thoracic inlet.  Genitourinary: Negative.   Musculoskeletal: Negative.   Skin: Negative.   Psychiatric/Behavioral: Negative.     Allergies  Penicillins; Codeine; and Darvocet  Home Medications   Current Outpatient Rx  Name Route Sig Dispense Refill  . BENADRYL ALLERGY PO Oral Take 1 tablet by mouth daily as needed. For allergies.    Marland Kitchen FAMOTIDINE 10 MG PO TABS  Oral Take 10 mg by mouth daily as needed. Acid reflux    . GABAPENTIN (PHN) 600 MG PO TABS Oral Take 1 tablet by mouth daily.    Marland Kitchen MAGNESIUM MALATE POWD Does not apply 833 mg by Does not apply route daily.    . MULTI-VITAMIN/MINERALS PO TABS Oral Take 1 tablet by mouth daily.    . OMEGA 3 PO Oral Take 1 capsule by mouth daily.    Marland Kitchen SIMETHICONE 80 MG PO CHEW Oral Chew 160 mg by mouth every 6 (six) hours as needed. Stomach pain    . TACROLIMUS 0.1 % EX OINT Topical Apply topically 2 (two) times daily.    . TRAMADOL HCL 50 MG PO TABS Oral Take 50 mg by mouth every 6 (six) hours as needed. Pain    .  OXYCODONE-ACETAMINOPHEN 5-325 MG PO TABS Oral Take 1-2 tablets by mouth every 4 (four) hours as needed for pain. 30 tablet 0    BP 117/68  Pulse 81  Temp(Src) 97.7 F (36.5 C) (Oral)  Resp 16  SpO2 100%  LMP 09/27/2011  Physical Exam  Nursing note and vitals reviewed. Constitutional: She is oriented to person, place, and time. She appears well-developed and well-nourished. No distress.  HENT:  Head: Normocephalic and atraumatic.  Right Ear: External ear normal.  Left Ear: External ear normal.  Mouth/Throat: Oropharynx is clear and moist.  Eyes: Conjunctivae and EOM are normal. Pupils are equal, round, and reactive to light.  Neck: Normal range of motion. Neck supple.       Palpable deformity of the neck. No apparent difficulty on attempting his father.  Cardiovascular: Normal rate, regular rhythm and normal heart sounds.   Pulmonary/Chest: Effort normal and breath sounds normal.  Abdominal: Soft. Bowel sounds are normal.  Musculoskeletal: Normal range of motion.  Lymphadenopathy:    She has no cervical adenopathy.  Neurological: She is alert and oriented to person, place, and time.       No sensory or motor deficit.  Skin: Skin is warm and dry.  Psychiatric: She has a normal mood and affect. Her behavior is normal.    ED Course  Procedures (including critical care time)  7:06 PM  Date: 10/28/2011  Rate:79  Rhythm: normal sinus rhythm  QRS Axis: normal  Intervals: normal QRS:  Left ventricular hypertrophy  ST/T Wave abnormalities: normal  Conduction Disutrbances:none  Narrative Interpretation: BorderlineEKG  Old EKG Reviewed: unchanged  7:26 PM Patient seen and had physical examination. Soft tissue x-ray of the neck was ordered. 8:22 PM Soft tissue x-rays of the neck were negative.  Pt reassured and released.  If her symptom of difficulty swallowing were to persist, the next test to evaluate it would be a barium swallow x-ray.  1. Dysphagia           Carleene Cooper III, MD 10/28/11 2027

## 2011-11-24 ENCOUNTER — Other Ambulatory Visit: Payer: Self-pay | Admitting: Physician Assistant

## 2011-11-24 ENCOUNTER — Ambulatory Visit
Admission: RE | Admit: 2011-11-24 | Discharge: 2011-11-24 | Disposition: A | Discharge status: Home / Self Care | Payer: BC Managed Care – PPO | Source: Ambulatory Visit | Attending: Physician Assistant | Admitting: Physician Assistant

## 2011-11-24 DIAGNOSIS — M79609 Pain in unspecified limb: Secondary | ICD-10-CM

## 2012-05-20 ENCOUNTER — Other Ambulatory Visit: Payer: Self-pay | Admitting: Family Medicine

## 2012-05-20 DIAGNOSIS — Z1231 Encounter for screening mammogram for malignant neoplasm of breast: Secondary | ICD-10-CM

## 2012-05-30 ENCOUNTER — Ambulatory Visit
Admission: RE | Admit: 2012-05-30 | Discharge: 2012-05-30 | Disposition: A | Discharge status: Home / Self Care | Payer: BC Managed Care – PPO | Source: Ambulatory Visit | Attending: Family Medicine | Admitting: Family Medicine

## 2012-05-30 DIAGNOSIS — Z1231 Encounter for screening mammogram for malignant neoplasm of breast: Secondary | ICD-10-CM

## 2012-07-22 ENCOUNTER — Other Ambulatory Visit: Payer: Self-pay | Admitting: Dermatology

## 2013-03-01 ENCOUNTER — Telehealth: Payer: Self-pay | Admitting: Diagnostic Neuroimaging

## 2013-03-01 NOTE — Telephone Encounter (Signed)
Calling patient to reschedule her appt with Dr. Marjory Lies

## 2013-03-13 ENCOUNTER — Ambulatory Visit
Admission: RE | Admit: 2013-03-13 | Discharge: 2013-03-13 | Disposition: A | Discharge status: Home / Self Care | Payer: BC Managed Care – PPO | Source: Ambulatory Visit | Attending: Family Medicine | Admitting: Family Medicine

## 2013-03-13 ENCOUNTER — Other Ambulatory Visit: Payer: Self-pay | Admitting: Family Medicine

## 2013-03-13 DIAGNOSIS — Z9882 Breast implant status: Secondary | ICD-10-CM

## 2013-03-13 DIAGNOSIS — S0990XA Unspecified injury of head, initial encounter: Secondary | ICD-10-CM

## 2013-03-13 DIAGNOSIS — N6001 Solitary cyst of right breast: Secondary | ICD-10-CM

## 2013-03-13 DIAGNOSIS — N644 Mastodynia: Secondary | ICD-10-CM

## 2013-03-23 ENCOUNTER — Ambulatory Visit
Admission: RE | Admit: 2013-03-23 | Discharge: 2013-03-23 | Disposition: A | Discharge status: Home / Self Care | Payer: BC Managed Care – PPO | Source: Ambulatory Visit | Attending: Family Medicine | Admitting: Family Medicine

## 2013-03-23 DIAGNOSIS — N644 Mastodynia: Secondary | ICD-10-CM

## 2013-03-23 DIAGNOSIS — N6001 Solitary cyst of right breast: Secondary | ICD-10-CM

## 2013-03-23 DIAGNOSIS — Z9882 Breast implant status: Secondary | ICD-10-CM

## 2013-05-01 ENCOUNTER — Ambulatory Visit: Payer: Self-pay | Admitting: Diagnostic Neuroimaging

## 2013-05-10 ENCOUNTER — Ambulatory Visit: Payer: Self-pay | Admitting: Diagnostic Neuroimaging

## 2013-06-08 ENCOUNTER — Ambulatory Visit: Payer: Self-pay | Admitting: Diagnostic Neuroimaging

## 2013-11-03 ENCOUNTER — Encounter: Payer: Self-pay | Admitting: Gastroenterology

## 2014-04-11 ENCOUNTER — Ambulatory Visit
Admission: RE | Admit: 2014-04-11 | Discharge: 2014-04-11 | Disposition: A | Discharge status: Home / Self Care | Payer: BC Managed Care – PPO | Source: Ambulatory Visit | Attending: Physician Assistant | Admitting: Physician Assistant

## 2014-04-11 ENCOUNTER — Other Ambulatory Visit: Payer: Self-pay | Admitting: Physician Assistant

## 2014-04-11 DIAGNOSIS — R52 Pain, unspecified: Secondary | ICD-10-CM

## 2014-05-14 ENCOUNTER — Other Ambulatory Visit: Payer: Self-pay | Admitting: Dermatology

## 2014-06-05 ENCOUNTER — Encounter: Payer: Self-pay | Admitting: Gastroenterology

## 2014-07-04 ENCOUNTER — Encounter: Payer: Self-pay | Admitting: Gastroenterology

## 2014-07-05 ENCOUNTER — Other Ambulatory Visit: Payer: Self-pay | Admitting: Family Medicine

## 2014-07-05 DIAGNOSIS — N644 Mastodynia: Secondary | ICD-10-CM

## 2014-07-20 ENCOUNTER — Other Ambulatory Visit: Payer: Self-pay | Admitting: Family Medicine

## 2014-07-20 ENCOUNTER — Ambulatory Visit
Admission: RE | Admit: 2014-07-20 | Discharge: 2014-07-20 | Disposition: A | Discharge status: Home / Self Care | Payer: BC Managed Care – PPO | Source: Ambulatory Visit | Attending: Family Medicine | Admitting: Family Medicine

## 2014-07-20 ENCOUNTER — Encounter (INDEPENDENT_AMBULATORY_CARE_PROVIDER_SITE_OTHER): Payer: Self-pay

## 2014-07-20 DIAGNOSIS — N644 Mastodynia: Secondary | ICD-10-CM

## 2014-08-31 ENCOUNTER — Encounter: Payer: BC Managed Care – PPO | Admitting: Gastroenterology

## 2014-09-25 ENCOUNTER — Ambulatory Visit (AMBULATORY_SURGERY_CENTER): Payer: Self-pay

## 2014-09-25 VITALS — Ht 64.75 in | Wt 136.6 lb

## 2014-09-25 DIAGNOSIS — Z8371 Family history of colonic polyps: Secondary | ICD-10-CM

## 2014-09-25 DIAGNOSIS — Z800 Family history of malignant neoplasm of digestive organs: Secondary | ICD-10-CM

## 2014-09-25 MED ORDER — MOVIPREP 100 G PO SOLR: ORAL | Status: DC

## 2014-09-25 NOTE — Progress Notes (Signed)
Per pt, no allergies to soy or egg products.Pt not taking any weight loss meds or using  O2 at home. 

## 2014-10-02 ENCOUNTER — Encounter: Payer: Self-pay | Admitting: Gastroenterology

## 2014-10-02 ENCOUNTER — Ambulatory Visit (AMBULATORY_SURGERY_CENTER): Payer: BLUE CROSS/BLUE SHIELD | Admitting: Gastroenterology

## 2014-10-02 VITALS — BP 94/72 | HR 64 | Temp 97.2°F | Resp 14 | Ht 64.75 in | Wt 136.0 lb

## 2014-10-02 DIAGNOSIS — Z8371 Family history of colonic polyps: Secondary | ICD-10-CM

## 2014-10-02 DIAGNOSIS — D129 Benign neoplasm of anus and anal canal: Secondary | ICD-10-CM

## 2014-10-02 DIAGNOSIS — D120 Benign neoplasm of cecum: Secondary | ICD-10-CM

## 2014-10-02 DIAGNOSIS — Z800 Family history of malignant neoplasm of digestive organs: Secondary | ICD-10-CM

## 2014-10-02 DIAGNOSIS — Z83719 Family history of colon polyps, unspecified: Secondary | ICD-10-CM

## 2014-10-02 DIAGNOSIS — Z1211 Encounter for screening for malignant neoplasm of colon: Secondary | ICD-10-CM

## 2014-10-02 DIAGNOSIS — D128 Benign neoplasm of rectum: Secondary | ICD-10-CM

## 2014-10-02 MED ORDER — SODIUM CHLORIDE 0.9 % IV SOLN: 500.0000 mL | INTRAVENOUS | Status: DC

## 2014-10-02 NOTE — Progress Notes (Signed)
Called to room to assist during endoscopic procedure.  Patient ID and intended procedure confirmed with present staff. Received instructions for my participation in the procedure from the performing physician.  

## 2014-10-02 NOTE — Op Note (Signed)
[facility] [place]  [address] [place], [address]   COLONOSCOPY PROCEDURE REPORT  PATIENT: Amber Singleton, Amber Singleton  MR#: [id number] BIRTHDATE: 09-01-1961 , 68  yrs. old GENDER: female ENDOSCOPIST: Ladene Artist, MD, [facility] PROCEDURE DATE:  10/02/2014 PROCEDURE:   Colonoscopy with biopsy and Colonoscopy with snare polypectomy First Screening Colonoscopy - Avg.  risk and is 50 yrs.  old or older - No.  Prior Negative Screening - Now for repeat screening. N/A  History of Adenoma - Now for follow-up colonoscopy & has been > or = to 3 yrs.  N/A  Polyps Removed Today? Yes. ASA CLASS:   Class II INDICATIONS:patient's family history of colon cancer, distant relatives and patient's family history of colon polyps. MEDICATIONS: Monitored anesthesia care and Propofol 300 mg IV DESCRIPTION OF PROCEDURE:   After the risks benefits and alternatives of the procedure were thoroughly explained, informed consent was obtained.  The digital rectal exam revealed no abnormalities of the rectum.   The     endoscope was introduced through the anus and advanced to the cecum, which was identified by both the appendix and ileocecal valve. No adverse events experienced.   The quality of the prep was excellent, using MoviPrep  The instrument was then slowly withdrawn as the colon was fully examined.  COLON FINDINGS: A sessile polyp measuring 11 mm in size was found at the cecum.  A polypectomy was performed using snare cautery.  The resection was complete, the polyp tissue was completely retrieved and sent to histology.   A sessile polyp measuring 4 mm in size was found in the rectum.  A polypectomy was performed with cold forceps.  The resection was complete, the polyp tissue was completely retrieved and sent to histology. There was mild diverticulosis noted in the sigmoid colon. The examination was otherwise normal.  Retroflexed views revealed internal Grade I hemorrhoids. The time to cecum=4  minutes 14 seconds.  Withdrawal time=10 minutes 00 seconds.  The scope was withdrawn and the procedure completed. COMPLICATIONS: There were no immediate complications.  ENDOSCOPIC IMPRESSION: 1.   Sessile polyp at the cecum; polypectomy performed using snare cautery 2.   Sessile polyp in the rectum; polypectomy performed with cold forceps 3.   Mild diverticulosis in the sigmoid colon 4.   Grade I internal hemorrhoids  RECOMMENDATIONS: 1.  Hold Aspirin and all other NSAIDS for 2 weeks. 2.  Await pathology results 3.  Repeat Colonoscopy in 3 years.  eSigned:  Ladene Artist, MD, [facility] 10/02/2014 9:07 AM   cc: Kelton Pillar, MD

## 2014-10-02 NOTE — Patient Instructions (Addendum)

## 2014-10-02 NOTE — Progress Notes (Signed)
Procedure ends, to recovery, report given and VSS. 

## 2014-10-03 ENCOUNTER — Telehealth: Payer: Self-pay | Admitting: *Deleted

## 2014-10-03 NOTE — Telephone Encounter (Signed)
Message left

## 2014-10-09 ENCOUNTER — Encounter: Payer: Self-pay | Admitting: Gastroenterology

## 2014-10-22 IMAGING — CT CT HEAD W/O CM
2 series · 16 of 30 positions shown, 20 images · non-contrast
Comparison: MRI 05/10/2009

CLINICAL DATA: Head injury with pain.  History of multiple
sclerosis.

CT HEAD WITHOUT CONTRAST
TECHNIQUE: Contiguous axial images were obtained from the base of
the skull through the vertex without contrast.

[Series 3: head bone · axial · 0.49mm/px · z∈[+31,+71]mm · 3 of 28 slices shown]
[im 2/28  bone]
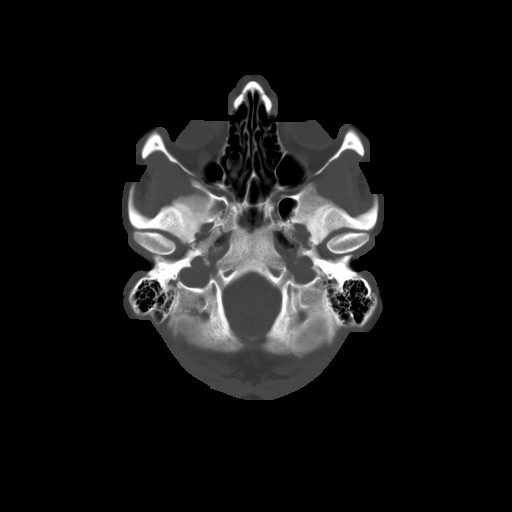
[im 6/28  bone]
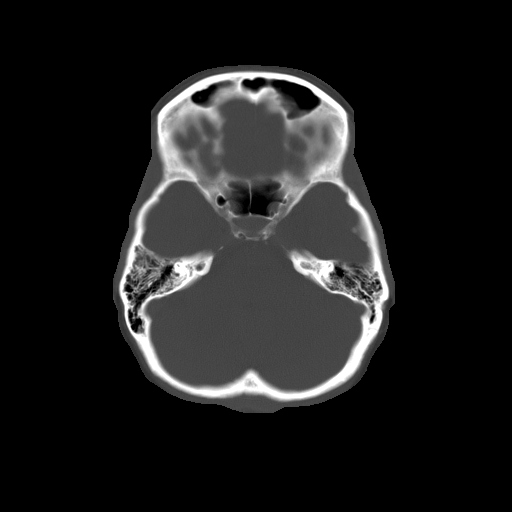
[im 10/28  bone]
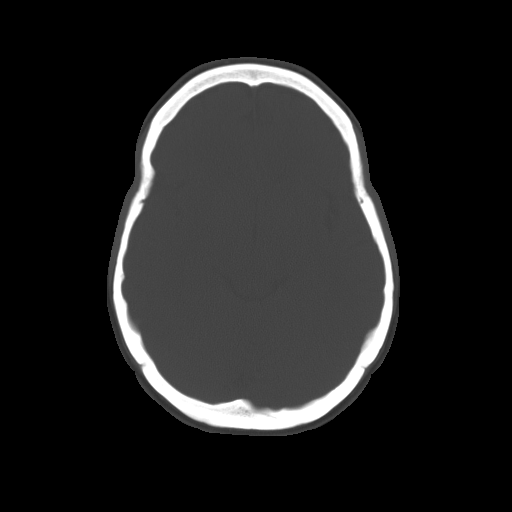

[Series 32: 3d filtered head w/o · axial · non-contrast · 0.49mm/px · z∈[+31,+152]mm · 13 of 28 slices shown, 17 images]
[im 2/28  brain]
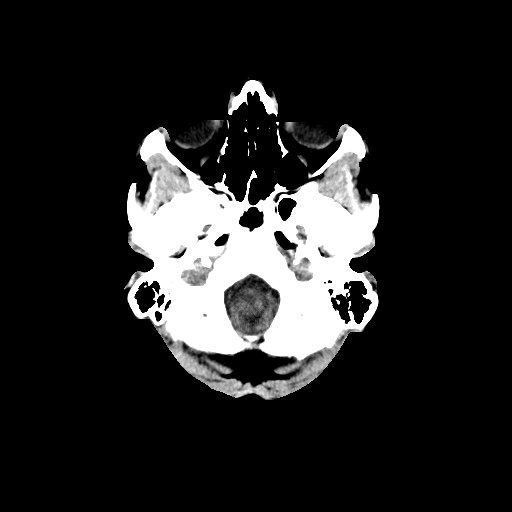
[im 2/28  bone]
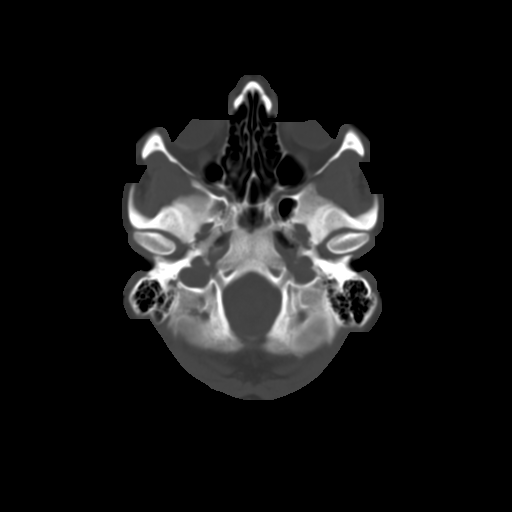
[im 4/28  brain]
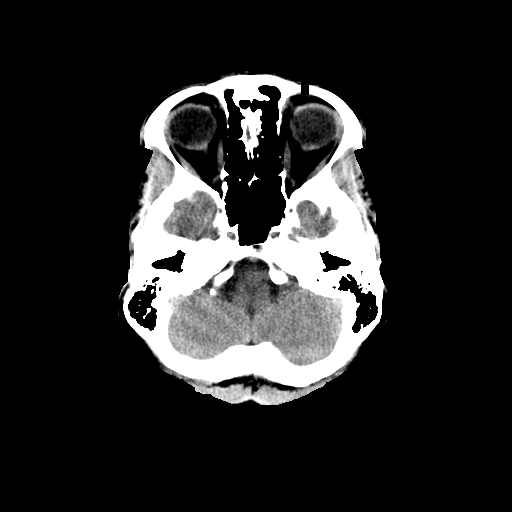
[im 6/28  brain]
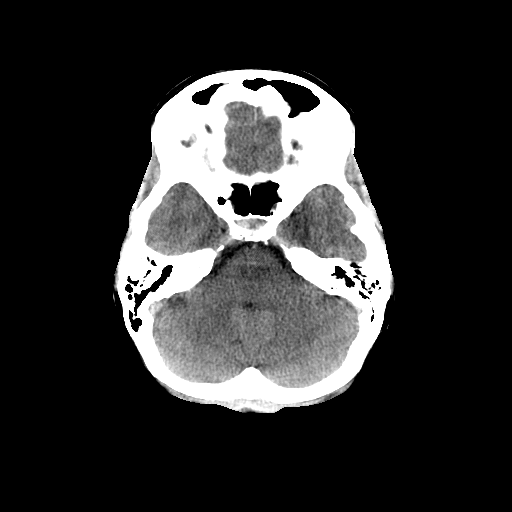
[im 8/28  brain]
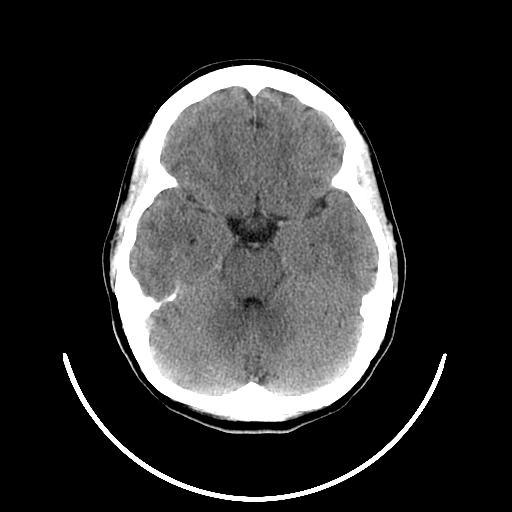
[im 10/28  brain]
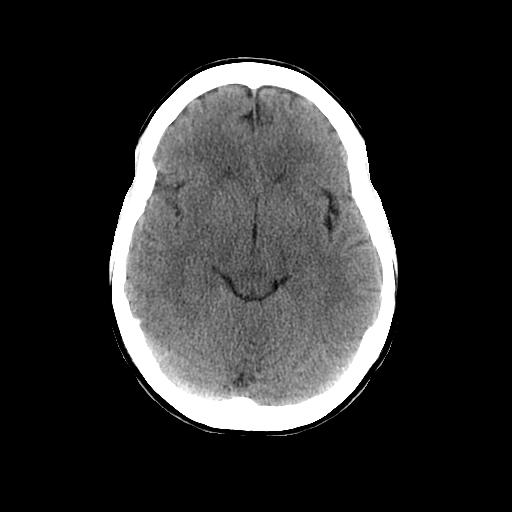
[im 10/28  bone]
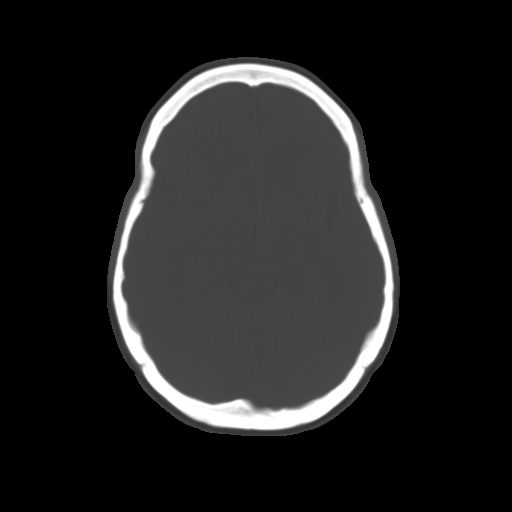
[im 12/28  brain]
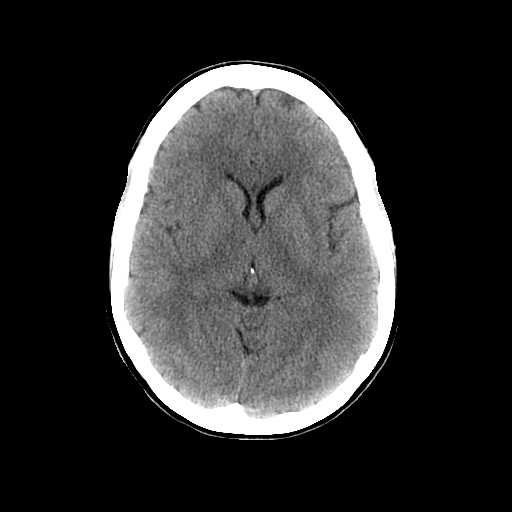
[im 14/28  brain]
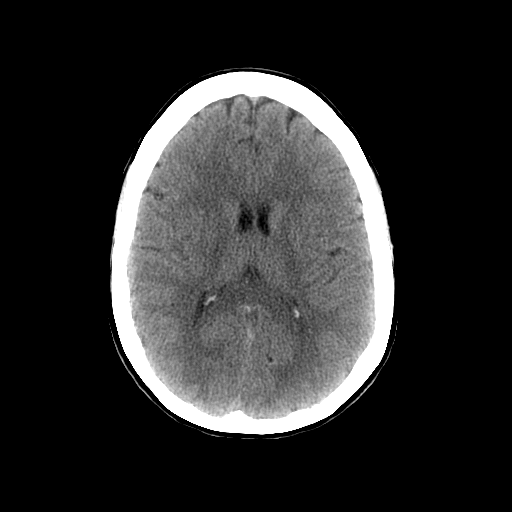
[im 16/28  brain]
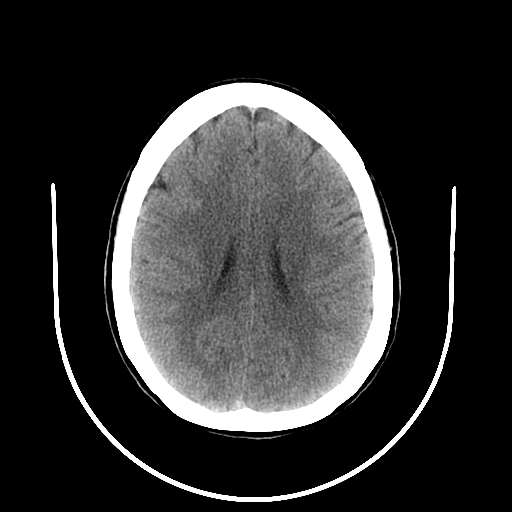
[im 18/28  brain]
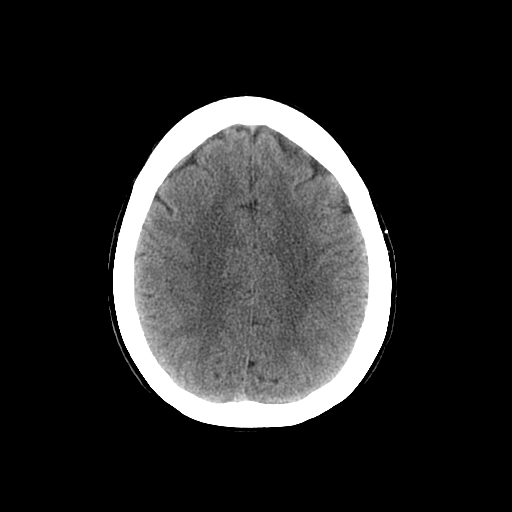
[im 18/28  bone]
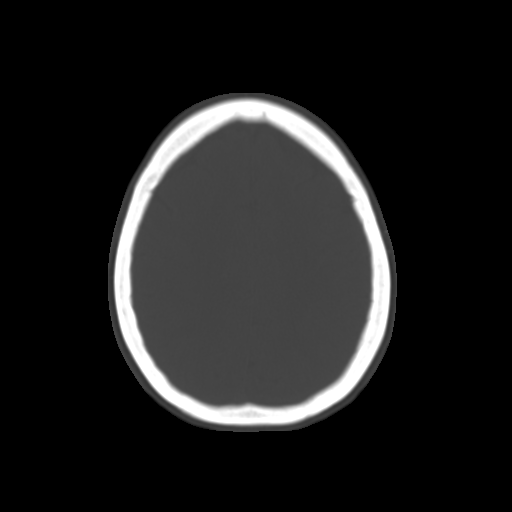
[im 20/28  brain]
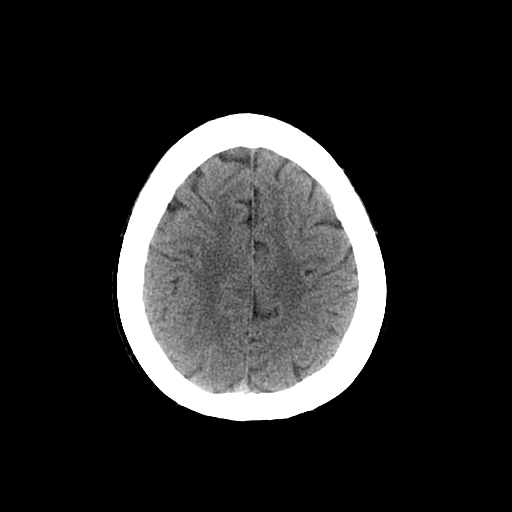
[im 22/28  brain]
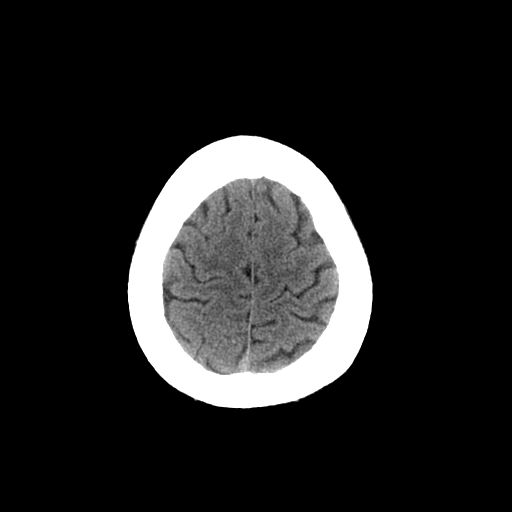
[im 24/28  brain]
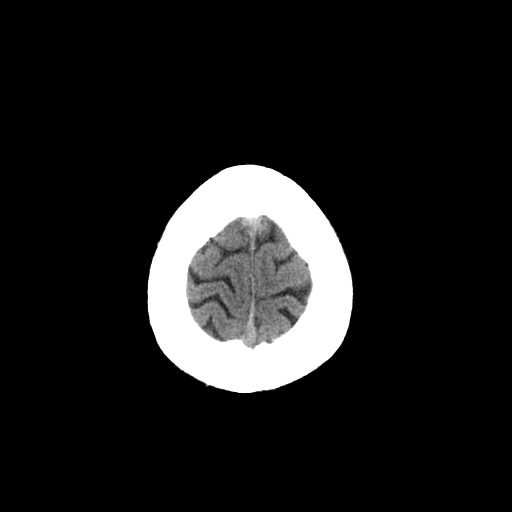
[im 26/28  brain]
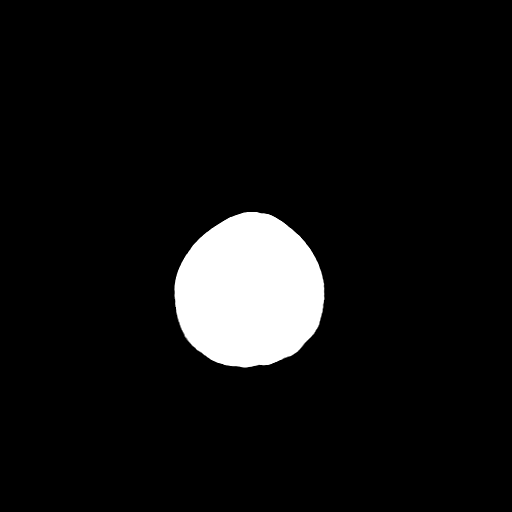
[im 26/28  bone]
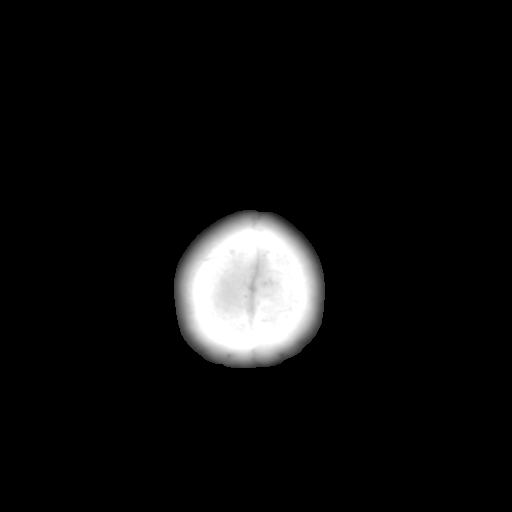

[16 of 30 positions shown; findings below may reference images not displayed]

FINDINGS: Cerebral volume is normal.  Negative for acute infarct.
Negative for hemorrhage or mass.

Small periventricular white matter hyperintensities are seen on
MRI, consistent with MS.  These are not well seen by CT.

Calvarium intact.
IMPRESSION: No acute abnormality.

## 2014-11-12 ENCOUNTER — Other Ambulatory Visit: Payer: Self-pay | Admitting: Dermatology

## 2015-05-06 ENCOUNTER — Other Ambulatory Visit: Payer: Self-pay | Admitting: Orthopedic Surgery

## 2015-05-15 ENCOUNTER — Other Ambulatory Visit: Payer: Self-pay | Admitting: Family Medicine

## 2015-05-15 ENCOUNTER — Ambulatory Visit
Admission: RE | Admit: 2015-05-15 | Discharge: 2015-05-15 | Disposition: A | Discharge status: Home / Self Care | Payer: BLUE CROSS/BLUE SHIELD | Source: Ambulatory Visit | Attending: Family Medicine | Admitting: Family Medicine

## 2015-05-15 DIAGNOSIS — R51 Headache: Principal | ICD-10-CM

## 2015-05-15 DIAGNOSIS — R410 Disorientation, unspecified: Secondary | ICD-10-CM

## 2015-05-15 DIAGNOSIS — R519 Headache, unspecified: Secondary | ICD-10-CM

## 2015-05-27 ENCOUNTER — Other Ambulatory Visit: Payer: Self-pay | Admitting: Orthopedic Surgery

## 2015-05-29 ENCOUNTER — Ambulatory Visit (HOSPITAL_BASED_OUTPATIENT_CLINIC_OR_DEPARTMENT_OTHER): Payer: BLUE CROSS/BLUE SHIELD | Admitting: Anesthesiology

## 2015-05-29 ENCOUNTER — Encounter (HOSPITAL_BASED_OUTPATIENT_CLINIC_OR_DEPARTMENT_OTHER)
Admission: RE | Disposition: A | Discharge status: Home / Self Care | Payer: Self-pay | Source: Ambulatory Visit | Attending: Orthopedic Surgery

## 2015-05-29 ENCOUNTER — Ambulatory Visit (HOSPITAL_BASED_OUTPATIENT_CLINIC_OR_DEPARTMENT_OTHER)
Admission: RE | Admit: 2015-05-29 | Discharge: 2015-05-29 | Disposition: A | Discharge status: Home / Self Care | Payer: BLUE CROSS/BLUE SHIELD | Source: Ambulatory Visit | Attending: Orthopedic Surgery | Admitting: Orthopedic Surgery

## 2015-05-29 ENCOUNTER — Encounter (HOSPITAL_BASED_OUTPATIENT_CLINIC_OR_DEPARTMENT_OTHER): Payer: Self-pay | Admitting: Anesthesiology

## 2015-05-29 DIAGNOSIS — K589 Irritable bowel syndrome without diarrhea: Secondary | ICD-10-CM | POA: Diagnosis not present

## 2015-05-29 DIAGNOSIS — S83241A Other tear of medial meniscus, current injury, right knee, initial encounter: Secondary | ICD-10-CM | POA: Insufficient documentation

## 2015-05-29 DIAGNOSIS — Z79899 Other long term (current) drug therapy: Secondary | ICD-10-CM | POA: Diagnosis not present

## 2015-05-29 DIAGNOSIS — M2241 Chondromalacia patellae, right knee: Secondary | ICD-10-CM | POA: Insufficient documentation

## 2015-05-29 DIAGNOSIS — M6751 Plica syndrome, right knee: Secondary | ICD-10-CM | POA: Insufficient documentation

## 2015-05-29 DIAGNOSIS — X58XXXA Exposure to other specified factors, initial encounter: Secondary | ICD-10-CM | POA: Diagnosis not present

## 2015-05-29 DIAGNOSIS — E785 Hyperlipidemia, unspecified: Secondary | ICD-10-CM | POA: Insufficient documentation

## 2015-05-29 DIAGNOSIS — Z791 Long term (current) use of non-steroidal anti-inflammatories (NSAID): Secondary | ICD-10-CM | POA: Insufficient documentation

## 2015-05-29 DIAGNOSIS — G35 Multiple sclerosis: Secondary | ICD-10-CM | POA: Insufficient documentation

## 2015-05-29 HISTORY — PX: CHONDROPLASTY: SHX5177

## 2015-05-29 HISTORY — PX: KNEE ARTHROSCOPY WITH MEDIAL MENISECTOMY: SHX5651

## 2015-05-29 HISTORY — PX: KNEE ARTHROSCOPY WITH EXCISION PLICA: SHX5647

## 2015-05-29 SURGERY — ARTHROSCOPY, KNEE, WITH MEDIAL MENISCECTOMY
Anesthesia: General | Site: Knee | Laterality: Right

## 2015-05-29 MED ORDER — OXYCODONE-ACETAMINOPHEN 5-325 MG PO TABS: 1.0000 | ORAL_TABLET | Freq: Four times a day (QID) | ORAL | Status: DC | PRN

## 2015-05-29 MED ORDER — FENTANYL CITRATE (PF) 100 MCG/2ML IJ SOLN
INTRAMUSCULAR | Status: AC
  Filled 2015-05-29: qty 2

## 2015-05-29 MED ORDER — EPINEPHRINE HCL 1 MG/ML IJ SOLN
INTRAMUSCULAR | Status: DC | PRN
  Administered 2015-05-29: 1 mg

## 2015-05-29 MED ORDER — FENTANYL CITRATE (PF) 100 MCG/2ML IJ SOLN
50.0000 ug | INTRAMUSCULAR | Status: DC | PRN
  Administered 2015-05-29: 100 ug via INTRAVENOUS

## 2015-05-29 MED ORDER — MIDAZOLAM HCL 2 MG/2ML IJ SOLN
1.0000 mg | INTRAMUSCULAR | Status: DC | PRN
  Administered 2015-05-29: 2 mg via INTRAVENOUS

## 2015-05-29 MED ORDER — SCOPOLAMINE 1 MG/3DAYS TD PT72: 1.0000 | MEDICATED_PATCH | Freq: Once | TRANSDERMAL | Status: DC | PRN

## 2015-05-29 MED ORDER — CHLORHEXIDINE GLUCONATE 4 % EX LIQD: 60.0000 mL | Freq: Once | CUTANEOUS | Status: DC

## 2015-05-29 MED ORDER — BUPIVACAINE HCL (PF) 0.5 % IJ SOLN
INTRAMUSCULAR | Status: AC
  Filled 2015-05-29: qty 30

## 2015-05-29 MED ORDER — FENTANYL CITRATE (PF) 100 MCG/2ML IJ SOLN
25.0000 ug | INTRAMUSCULAR | Status: DC | PRN
  Administered 2015-05-29: 50 ug via INTRAVENOUS

## 2015-05-29 MED ORDER — FENTANYL CITRATE (PF) 100 MCG/2ML IJ SOLN
INTRAMUSCULAR | Status: AC
  Filled 2015-05-29: qty 4

## 2015-05-29 MED ORDER — KETOROLAC TROMETHAMINE 30 MG/ML IJ SOLN
30.0000 mg | Freq: Once | INTRAMUSCULAR | Status: AC
  Administered 2015-05-29: 30 mg via INTRAVENOUS

## 2015-05-29 MED ORDER — MIDAZOLAM HCL 2 MG/2ML IJ SOLN
INTRAMUSCULAR | Status: AC
  Filled 2015-05-29: qty 4

## 2015-05-29 MED ORDER — PROMETHAZINE HCL 12.5 MG PO TABS: 12.5000 mg | ORAL_TABLET | Freq: Four times a day (QID) | ORAL | Status: DC | PRN

## 2015-05-29 MED ORDER — LIDOCAINE HCL (CARDIAC) 20 MG/ML IV SOLN
INTRAVENOUS | Status: AC
  Filled 2015-05-29: qty 5

## 2015-05-29 MED ORDER — SODIUM CHLORIDE 0.9 % IR SOLN
Status: DC | PRN
  Administered 2015-05-29: 3000 mL

## 2015-05-29 MED ORDER — CLINDAMYCIN PHOSPHATE 900 MG/50ML IV SOLN
INTRAVENOUS | Status: AC
  Filled 2015-05-29: qty 50

## 2015-05-29 MED ORDER — HYDROMORPHONE HCL 2 MG PO TABS
2.0000 mg | ORAL_TABLET | ORAL | Status: DC | PRN
Start: 2015-05-29 — End: 2015-07-26

## 2015-05-29 MED ORDER — EPINEPHRINE HCL 1 MG/ML IJ SOLN
INTRAMUSCULAR | Status: AC
  Filled 2015-05-29: qty 1

## 2015-05-29 MED ORDER — DEXAMETHASONE SODIUM PHOSPHATE 10 MG/ML IJ SOLN
INTRAMUSCULAR | Status: AC
  Filled 2015-05-29: qty 1

## 2015-05-29 MED ORDER — LACTATED RINGERS IV SOLN
INTRAVENOUS | Status: DC
  Administered 2015-05-29 (×2): via INTRAVENOUS

## 2015-05-29 MED ORDER — ONDANSETRON HCL 4 MG/2ML IJ SOLN
INTRAMUSCULAR | Status: AC
  Filled 2015-05-29: qty 2

## 2015-05-29 MED ORDER — PROMETHAZINE HCL 25 MG/ML IJ SOLN
6.2500 mg | INTRAMUSCULAR | Status: DC | PRN
Start: 2015-05-29 — End: 2015-05-29
  Administered 2015-05-29: 6.25 mg via INTRAVENOUS

## 2015-05-29 MED ORDER — LIDOCAINE HCL (CARDIAC) 20 MG/ML IV SOLN
INTRAVENOUS | Status: DC | PRN
  Administered 2015-05-29: 50 mg via INTRAVENOUS

## 2015-05-29 MED ORDER — PROPOFOL 10 MG/ML IV BOLUS
INTRAVENOUS | Status: DC | PRN
  Administered 2015-05-29: 200 mg via INTRAVENOUS

## 2015-05-29 MED ORDER — KETOROLAC TROMETHAMINE 30 MG/ML IJ SOLN
INTRAMUSCULAR | Status: AC
  Filled 2015-05-29: qty 1

## 2015-05-29 MED ORDER — PROMETHAZINE HCL 25 MG/ML IJ SOLN
INTRAMUSCULAR | Status: AC
  Filled 2015-05-29: qty 1

## 2015-05-29 MED ORDER — GLYCOPYRROLATE 0.2 MG/ML IJ SOLN: 0.2000 mg | Freq: Once | INTRAMUSCULAR | Status: DC | PRN

## 2015-05-29 MED ORDER — ONDANSETRON HCL 4 MG/2ML IJ SOLN
INTRAMUSCULAR | Status: DC | PRN
  Administered 2015-05-29: 4 mg via INTRAVENOUS

## 2015-05-29 MED ORDER — DEXAMETHASONE SODIUM PHOSPHATE 10 MG/ML IJ SOLN
INTRAMUSCULAR | Status: DC | PRN
  Administered 2015-05-29: 10 mg via INTRAVENOUS

## 2015-05-29 MED ORDER — CLINDAMYCIN PHOSPHATE 900 MG/50ML IV SOLN
900.0000 mg | INTRAVENOUS | Status: AC
  Administered 2015-05-29: 900 mg via INTRAVENOUS

## 2015-05-29 SURGICAL SUPPLY — 36 items
BANDAGE ELASTIC 6 VELCRO ST LF (GAUZE/BANDAGES/DRESSINGS) ×3 IMPLANT
BLADE 4.2CUDA (BLADE) IMPLANT
BLADE GREAT WHITE 4.2 (BLADE) ×3 IMPLANT
CUTTER MENISCUS  4.2MM (BLADE)
CUTTER MENISCUS 4.2MM (BLADE) IMPLANT
DRAPE ARTHROSCOPY W/POUCH 114 (DRAPES) ×3 IMPLANT
DRSG EMULSION OIL 3X3 NADH (GAUZE/BANDAGES/DRESSINGS) ×3 IMPLANT
DURAPREP 26ML APPLICATOR (WOUND CARE) ×3 IMPLANT
ELECT MENISCUS 165MM 90D (ELECTRODE) IMPLANT
ELECT REM PT RETURN 9FT ADLT (ELECTROSURGICAL)
ELECTRODE REM PT RTRN 9FT ADLT (ELECTROSURGICAL) IMPLANT
GAUZE SPONGE 4X4 12PLY STRL (GAUZE/BANDAGES/DRESSINGS) ×3 IMPLANT
GLOVE BIOGEL PI IND STRL 8 (GLOVE) ×8 IMPLANT
GLOVE BIOGEL PI INDICATOR 8 (GLOVE) ×4
GLOVE ECLIPSE 7.5 STRL STRAW (GLOVE) ×6 IMPLANT
GOWN STRL REUS W/ TWL LRG LVL3 (GOWN DISPOSABLE) IMPLANT
GOWN STRL REUS W/ TWL XL LVL3 (GOWN DISPOSABLE) ×4 IMPLANT
GOWN STRL REUS W/TWL LRG LVL3 (GOWN DISPOSABLE)
GOWN STRL REUS W/TWL XL LVL3 (GOWN DISPOSABLE) ×5 IMPLANT
HOLDER KNEE FOAM BLUE (MISCELLANEOUS) ×3 IMPLANT
KNEE WRAP E Z 3 GEL PACK (MISCELLANEOUS) ×3 IMPLANT
MANIFOLD NEPTUNE II (INSTRUMENTS) ×3 IMPLANT
NDL SAFETY ECLIPSE 18X1.5 (NEEDLE) ×2 IMPLANT
NEEDLE HYPO 18GX1.5 SHARP (NEEDLE) ×1
PACK ARTHROSCOPY DSU (CUSTOM PROCEDURE TRAY) ×3 IMPLANT
PACK BASIN DAY SURGERY FS (CUSTOM PROCEDURE TRAY) ×3 IMPLANT
PAD CAST 4YDX4 CTTN HI CHSV (CAST SUPPLIES) ×2 IMPLANT
PADDING CAST COTTON 4X4 STRL (CAST SUPPLIES) ×1
PENCIL BUTTON HOLSTER BLD 10FT (ELECTRODE) IMPLANT
SET ARTHROSCOPY TUBING (MISCELLANEOUS) ×1
SET ARTHROSCOPY TUBING LN (MISCELLANEOUS) ×2 IMPLANT
SUT ETHILON 4 0 PS 2 18 (SUTURE) IMPLANT
SYR 5ML LL (SYRINGE) ×3 IMPLANT
TOWEL OR 17X24 6PK STRL BLUE (TOWEL DISPOSABLE) ×3 IMPLANT
TOWEL OR NON WOVEN STRL DISP B (DISPOSABLE) ×3 IMPLANT
WATER STERILE IRR 1000ML POUR (IV SOLUTION) ×3 IMPLANT

## 2015-05-29 NOTE — Brief Op Note (Signed)
05/29/2015  2:59 PM  PATIENT:  Amber Singleton  54 y.o. female  PRE-OPERATIVE DIAGNOSIS:  MEDIAL MENISCUS TEAR RIGHT KNEE  POST-OPERATIVE DIAGNOSIS:  MEDIAL MENISCUS TEAR RIGHT KNEE  PROCEDURE:  Procedure(s): RIGHT KNEE ARTHROSCOPY, PATELLA-FEMORAL CHONDROPLASTY, MEDIAL FEMORAL CHONDROPLASTY, PLICA EXCISION (Right)  SURGEON:  Surgeon(s) and Role:    * Dorna Leitz, MD - Primary  PHYSICIAN ASSISTANT:   ASSISTANTS: bethune   ANESTHESIA:   general  EBL:  Total I/O In: 1000 [I.V.:1000] Out: -   BLOOD ADMINISTERED:none  DRAINS: none   LOCAL MEDICATIONS USED:  MARCAINE     SPECIMEN:  No Specimen  DISPOSITION OF SPECIMEN:  N/A  COUNTS:  YES  TOURNIQUET:  * No tourniquets in log *  DICTATION: .Other Dictation: Dictation Number [id number]  PLAN OF CARE: Discharge to home after PACU  PATIENT DISPOSITION:  PACU - hemodynamically stable.   Delay start of Pharmacological VTE agent (>24hrs) due to surgical blood loss or risk of bleeding: no

## 2015-05-29 NOTE — Anesthesia Procedure Notes (Signed)
Procedure Name: LMA Insertion Date/Time: 05/29/2015 2:14 PM Performed by: Lieutenant Diego Pre-anesthesia Checklist: Patient identified, Emergency Drugs available, Suction available and Patient being monitored Patient Re-evaluated:Patient Re-evaluated prior to inductionOxygen Delivery Method: Circle System Utilized Preoxygenation: Pre-oxygenation with 100% oxygen Intubation Type: IV induction Ventilation: Mask ventilation without difficulty LMA: LMA inserted LMA Size: 4.0 Number of attempts: 1 Airway Equipment and Method: Bite block Placement Confirmation: positive ETCO2 and breath sounds checked- equal and bilateral Tube secured with: Tape Dental Injury: Teeth and Oropharynx as per pre-operative assessment

## 2015-05-29 NOTE — Anesthesia Postprocedure Evaluation (Signed)
  Anesthesia Post-op Note  Patient: Amber Singleton  Procedure(s) Performed: Procedure(s) (LRB): RIGHT KNEE ARTHROSCOPY, PATELLA-FEMORAL CHONDROPLASTY, MEDIAL FEMORAL CHONDROPLASTY, PLICA EXCISION (Right)  Patient Location: PACU  Anesthesia Type: General  Level of Consciousness: awake and alert   Airway and Oxygen Therapy: Patient Spontanous Breathing  Post-op Pain: mild  Post-op Assessment: Post-op Vital signs reviewed, Patient's Cardiovascular Status Stable, Respiratory Function Stable, Patent Airway and No signs of Nausea or vomiting  Last Vitals:  Filed Vitals:   05/29/15 1456  BP:   Pulse: 61  Temp:   Resp: 8    Post-op Vital Signs: stable   Complications: No apparent anesthesia complications

## 2015-05-29 NOTE — Anesthesia Preprocedure Evaluation (Addendum)
Anesthesia Evaluation  Patient identified by MRN, date of birth, ID band Patient awake    Reviewed: Allergy & Precautions, NPO status , Patient's Chart, lab work & pertinent test results  Airway Mallampati: II  TM Distance: >3 FB Neck ROM: Full    Dental no notable dental hx.    Pulmonary neg pulmonary ROS,    Pulmonary exam normal breath sounds clear to auscultation       Cardiovascular negative cardio ROS Normal cardiovascular exam Rhythm:Regular Rate:Normal     Neuro/Psych MS negative psych ROS   GI/Hepatic negative GI ROS, Neg liver ROS,   Endo/Other  negative endocrine ROS  Renal/GU negative Renal ROS  negative genitourinary   Musculoskeletal negative musculoskeletal ROS (+)   Abdominal   Peds negative pediatric ROS (+)  Hematology negative hematology ROS (+)   Anesthesia Other Findings   Reproductive/Obstetrics negative OB ROS                            Anesthesia Physical Anesthesia Plan  ASA: II  Anesthesia Plan: General   Post-op Pain Management:    Induction: Intravenous  Airway Management Planned: LMA  Additional Equipment:   Intra-op Plan:   Post-operative Plan: Extubation in OR  Informed Consent: I have reviewed the patients History and Physical, chart, labs and discussed the procedure including the risks, benefits and alternatives for the proposed anesthesia with the patient or authorized representative who has indicated his/her understanding and acceptance.   Dental advisory given  Plan Discussed with: CRNA and Surgeon  Anesthesia Plan Comments:         Anesthesia Quick Evaluation

## 2015-05-29 NOTE — H&P (Signed)
PREOPERATIVE H&P  Chief Complaint: r knee pain  HPI: Amber Singleton is a 54 y.o. female who presents for evaluation of r knee pain. It has been present for three months and has been worsening. She has failed conservative measures. Pain is rated as moderate.  Past Medical History  Diagnosis Date  . IBS (irritable bowel syndrome)   . IC (interstitial cystitis)   . MS (multiple sclerosis)   . Neuromuscular disorder     MS  . Hyperlipidemia     tx with diet  . Environmental allergies   . Bursitis of hip     Bil  . Post-operative nausea and vomiting    Past Surgical History  Procedure Laterality Date  . Cesarean section      x 2  . Tubal ligation    . Breast surgery      breast augmentation  . Novasure ablation  12/2007  . Dilation and curettage of uterus  12/2007  . Liposuction multiple body parts    . Wisdom tooth extraction    . Upper gastrointestinal endoscopy    . Colonscopy      x 2  . Abdominal hysterectomy    . Rhinoplasty     Social History   Social History  . Marital Status: Married    Spouse Name: N/A  . Number of Children: N/A  . Years of Education: N/A   Social History Main Topics  . Smoking status: Never Smoker   . Smokeless tobacco: Never Used  . Alcohol Use: 1.8 - 2.4 oz/week    3-4 Glasses of wine per week     Comment: wine 3-4 times per week  . Drug Use: No  . Sexual Activity: Yes    Birth Control/ Protection: Surgical   Other Topics Concern  . None   Social History Narrative   Family History  Problem Relation Age of Onset  . Breast cancer Mother   . Colon polyps Mother   . Lymphoma Father   . Diabetes Brother   . Colon cancer Maternal Grandmother   . Colon cancer Maternal Grandfather   . Stomach cancer Neg Hx    Allergies  Allergen Reactions  . Penicillins Anaphylaxis    Childhood reaction.  . Codeine Nausea Only  . Darvocet [Propoxyphene N-Acetaminophen] Nausea Only  . Sulfur     Leg pain   Prior to Admission medications    Medication Sig Start Date End Date Taking? Authorizing Provider  DiphenhydrAMINE HCl (BENADRYL ALLERGY PO) Take 1 tablet by mouth daily as needed. For allergies.   Yes Historical Provider, MD  ibuprofen (ADVIL) 200 MG tablet Take 200 mg by mouth every 6 (six) hours as needed.   Yes Historical Provider, MD  Magnesium Malate POWD 833 mg by Does not apply route daily.   Yes Historical Provider, MD  Multiple Vitamins-Minerals (MULTIVITAMIN WITH MINERALS) tablet Take 1 tablet by mouth daily.   Yes Historical Provider, MD  tacrolimus (PROTOPIC) 0.1 % ointment Apply topically as needed.     Historical Provider, MD     Positive ROS: none  All other systems have been reviewed and were otherwise negative with the exception of those mentioned in the HPI and as above.  Physical Exam: Filed Vitals:   05/29/15 1328  BP: 115/70  Pulse: 71  Temp: 97.9 F (36.6 C)  Resp: 16    General: Alert, no acute distress Cardiovascular: No pedal edema Respiratory: No cyanosis, no use of accessory musculature GI: No organomegaly, abdomen  is soft and non-tender Skin: No lesions in the area of chief complaint Neurologic: Sensation intact distally Psychiatric: Patient is competent for consent with normal mood and affect Lymphatic: No axillary or cervical lymphadenopathy  MUSCULOSKELETAL: r knee +mcmurray//-instability trace effusion  MRI +mmt  Assessment/Plan: MEDIAL MENISCUS TEAR RIGHT KNEE Plan for Procedure(s): RIGHT KNEE ARTHROSCOPY  The risks benefits and alternatives were discussed with the patient including but not limited to the risks of nonoperative treatment, versus surgical intervention including infection, bleeding, nerve injury, malunion, nonunion, hardware prominence, hardware failure, need for hardware removal, blood clots, cardiopulmonary complications, morbidity, mortality, among others, and they were willing to proceed.  Predicted outcome is good, although there will be at least a six to  nine month expected recovery.  Matea Stanard L, MD 05/29/2015 2:05 PM

## 2015-05-29 NOTE — Transfer of Care (Signed)
Immediate Anesthesia Transfer of Care Note  Patient: Amber Singleton  Procedure(s) Performed: Procedure(s): RIGHT KNEE ARTHROSCOPY, PATELLA-FEMORAL CHONDROPLASTY, MEDIAL FEMORAL CHONDROPLASTY, PLICA EXCISION (Right)  Patient Location: PACU  Anesthesia Type:General  Level of Consciousness: sedated  Airway & Oxygen Therapy: Patient Spontanous Breathing and Patient connected to face mask oxygen  Post-op Assessment: Report given to RN and Post -op Vital signs reviewed and stable  Post vital signs: Reviewed and stable  Last Vitals:  Filed Vitals:   05/29/15 1456  BP:   Pulse: 61  Temp:   Resp: 8    Complications: No apparent anesthesia complications

## 2015-05-29 NOTE — Discharge Instructions (Signed)
POST-OP KNEE ARTHROSCOPY INSTRUCTIONS  °Dr. John Graves/Jim Bethune PA-C ° °Pain °You will be expected to have a moderate amount of pain in the affected knee for approximately two weeks. However, the first two days will be the most severe pain. A prescription has been provided to take as needed for the pain. The pain can be reduced by applying ice packs to the knee for the first 1-2 weeks post surgery. Also, keeping the leg elevated on pillows will help alleviate the pain. If you develop any acute pain or swelling in your calf muscle, please call the doctor. ° °Activity °It is preferred that you stay at bed rest for approximately 24 hours. However, you may go to the bathroom with help. Weight bearing as tolerated. You may begin the knee exercises the day of surgery. Discontinue crutches as the knee pain resolves. ° °Dressing °Keep the dressing dry. If the ace bandage should wrinkle or roll up, this can be rewrapped to prevent ridges in the bandage. You may remove all dressings in 48 hours,  apply bandaids to each wound. You may shower on the 4th day after surgery but no tub bath. ° °Symptoms to report to your doctor °Extreme pain °Extreme swelling °Temperature above 101 degrees °Change in the feeling, color, or movement of your toes °Redness, heat, or swelling at your incision ° °Exercise °If is preferred that as soon as possible you try to do a straight leg raise without bending the knee and concentrate on bringing the heel of your foot off the bed up to approximately 45 degrees and hold for the count of 10 seconds. Repeat this at least 10 times three or four times per day. Additional exercises are provided below. ° °You are encouraged to bend the knee as tolerated. ° °Follow-Up °Call to schedule a follow-up appointment in 5-7 days. Our office # is [phone]. ° °POST-OP EXERCISES ° °Short Arc Quads ° °1. Lie on back with legs straight. Place towel roll under thigh, just above knee. °2. Tighten thigh muscles to  straighten knee and lift heel off bed. °3. Hold for slow count of five, then lower. °4. Do three sets of ten ° ° ° °Straight Leg Raises ° °1. Lie on back with operative leg straight and other leg bent. °2. Keeping operative leg completely straight, slowly lift operative leg so foot is 5 inches off bed. °3. Hold for slow count of five, then lower. °4. Do three sets of ten. ° ° ° °DO BOTH EXERCISES 2 TIMES A DAY ° °Ankle Pumps ° °Work/move the operative ankle and foot up and down 10 times every hour while awake. ° ° °Post Anesthesia Home Care Instructions ° °Activity: °Get plenty of rest for the remainder of the day. A responsible adult should stay with you for 24 hours following the procedure.  °For the next 24 hours, DO NOT: °-Drive a car °-Operate machinery °-Drink alcoholic beverages °-Take any medication unless instructed by your physician °-Make any legal decisions or sign important papers. ° °Meals: °Start with liquid foods such as gelatin or soup. Progress to regular foods as tolerated. Avoid greasy, spicy, heavy foods. If nausea and/or vomiting occur, drink only clear liquids until the nausea and/or vomiting subsides. Call your physician if vomiting continues. ° °Special Instructions/Symptoms: °Your throat may feel dry or sore from the anesthesia or the breathing tube placed in your throat during surgery. If this causes discomfort, gargle with warm salt water. The discomfort should disappear within 24 hours. ° °If you   had a scopolamine patch placed behind your ear for the management of post- operative nausea and/or vomiting: ° °1. The medication in the patch is effective for 72 hours, after which it should be removed.  Wrap patch in a tissue and discard in the trash. Wash hands thoroughly with soap and water. °2. You may remove the patch earlier than 72 hours if you experience unpleasant side effects which may include dry mouth, dizziness or visual disturbances. °3. Avoid touching the patch. Wash your hands  with soap and water after contact with the patch. °  ° °

## 2015-05-30 ENCOUNTER — Encounter (HOSPITAL_BASED_OUTPATIENT_CLINIC_OR_DEPARTMENT_OTHER): Payer: Self-pay | Admitting: Orthopedic Surgery

## 2015-05-30 NOTE — Op Note (Signed)
NAMECASIDEE, JANN              ACCOUNT NO.:  [id number]  MEDICAL RECORD NO.:  [id number]  LOCATION:                               FACILITY:  [facility]  PHYSICIAN:  Alta Corning, M.D.   DATE OF BIRTH:  1961/07/15  DATE OF PROCEDURE:  05/29/2015 DATE OF DISCHARGE:  05/29/2015                              OPERATIVE REPORT   PREOPERATIVE DIAGNOSIS:  Medial meniscal tear.  POSTOPERATIVE DIAGNOSES: 1. Medial meniscal tear. 2. 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 3. Medial shelf plica.  PROCEDURE: 1. Partial posterior horn medial meniscectomy. 2. Chondroplasty of medial femoral condyle and patellofemoral joint     down to bleeding bone when necessary. 3. Medial shelf plica excision.  SURGEONS:  Alta Corning, M.D.  ASSISTANT:  Gary Fleet, P.A.  ANESTHESIA:  General.  BRIEF HISTORY:  Ms. Kerekes is a 54 year old female with a history of significant complaints of right knee pain.  After failure of all conservative care, MRI was obtained, which showed medial meniscal tear with a flap.  She was brought to the operating room for evaluation and fixation.  DESCRIPTION OF PROCEDURE:  The patient was brought to the operating room.  After adequate anesthesia was obtained with general anesthetic, the patient was placed supine on the operating table.  The right leg was prepped and draped in usual sterile fashion.  Following this, routine arthroscopic examination of the knee revealed there was mild chondromalacia patellofemoral joint, which was debrided.  There was a large medial shelf plica, which was debrided.  Attention was turned to the medial compartment where there was quite significant chondromalacia of the medial femoral condyle grade 2 and maybe some grade 3 areas in nature.  This debrided back to a debrided back to a smooth and stable rim, where articular cartilage down to bleeding bone were necessary. Attention turned to  the medial meniscus where there was a large flap, which was debrided.  Attention turned to the ACL normal, lateral side normal.  At this point, the knee was copiously and thoroughly lavaged and suctioned dry.  The final check was made for loose fragment pieces of cartilage, seen none.  The knee was again irrigated and suctioned dry and a 20 mL 0.25% Marcaine was instilled in the knee for postoperative anesthesia. Sterile compressive dressing was applied.  The patient was taken to the recovery room noted to be in satisfactory condition.  Estimated blood loss for the procedure is minimal.     Alta Corning, M.D.     Corliss Skains  D:  05/29/2015  T:  05/30/2015  Job:  [id number]

## 2015-06-03 ENCOUNTER — Encounter (HOSPITAL_BASED_OUTPATIENT_CLINIC_OR_DEPARTMENT_OTHER): Payer: Self-pay | Admitting: Orthopedic Surgery

## 2015-07-26 ENCOUNTER — Ambulatory Visit (INDEPENDENT_AMBULATORY_CARE_PROVIDER_SITE_OTHER): Payer: BLUE CROSS/BLUE SHIELD | Admitting: Emergency Medicine

## 2015-07-26 VITALS — BP 130/80 | HR 85 | Temp 98.2°F | Resp 18 | Ht 65.0 in | Wt 136.0 lb

## 2015-07-26 DIAGNOSIS — B029 Zoster without complications: Secondary | ICD-10-CM | POA: Diagnosis not present

## 2015-07-26 MED ORDER — VALACYCLOVIR HCL 1 G PO TABS: 1000.0000 mg | ORAL_TABLET | Freq: Two times a day (BID) | ORAL | Status: DC

## 2015-07-26 NOTE — Progress Notes (Signed)
Subjective:  Patient ID: Amber Singleton, female    DOB: 12/25/1960  Age: 54 y.o. MRN: [id number]  CC: OTHER   HPI Amber Singleton presents  patient noticed that she has some tender bumps in the back of her left flank. As an achy feeling as though she has a virus. She has no fever or chills. Has headache. No cough or runny nose. No sore throat. No ear pain. She had she chickenpox as a child. Had no improvement discomfort with over-the-counter medication.  History Amber Singleton has a past medical history of IBS (irritable bowel syndrome); IC (interstitial cystitis); MS (multiple sclerosis) ([place]); Neuromuscular disorder ([place]); Hyperlipidemia; Environmental allergies; Bursitis of hip; Post-operative nausea and vomiting; and Allergy.   She has past surgical history that includes Cesarean section; Tubal ligation; Breast surgery; Novasure ablation (12/2007); Dilation and curettage of uterus (12/2007); Liposuction multiple body parts; Wisdom tooth extraction; Upper gastrointestinal endoscopy; colonscopy; Abdominal hysterectomy; Rhinoplasty; Knee arthroscopy with medial menisectomy (Right, 05/29/2015); Chondroplasty (Right, 05/29/2015); and Knee arthroscopy with excision plica (Right, [id number]).   Her  family history includes Breast cancer in her mother; Colon cancer in her maternal grandfather and maternal grandmother; Colon polyps in her mother; Diabetes in her brother; Lymphoma in her father. There is no history of Stomach cancer.  She   reports that she has never smoked. She has never used smokeless tobacco. She reports that she drinks about 1.8 - 2.4 oz of alcohol per week. She reports that she does not use illicit drugs.  Outpatient Prescriptions Prior to Visit  Medication Sig Dispense Refill  . DiphenhydrAMINE HCl (BENADRYL ALLERGY PO) Take 1 tablet by mouth daily as needed. For allergies.    . Magnesium Malate POWD 833 mg by Does not apply route daily.    . Multiple Vitamins-Minerals  (MULTIVITAMIN WITH MINERALS) tablet Take 1 tablet by mouth daily.    . tacrolimus (PROTOPIC) 0.1 % ointment Apply topically as needed.     Marland Kitchen HYDROmorphone (DILAUDID) 2 MG tablet Take 1 tablet (2 mg total) by mouth every 4 (four) hours as needed for severe pain. (Patient not taking: Reported on 07/26/2015) 30 tablet 0  . promethazine (PHENERGAN) 12.5 MG tablet Take 1 tablet (12.5 mg total) by mouth every 6 (six) hours as needed for nausea or vomiting. (Patient not taking: Reported on 07/26/2015) 20 tablet 0   No facility-administered medications prior to visit.    Social History   Social History  . Marital Status: Married    Spouse Name: N/A  . Number of Children: N/A  . Years of Education: N/A   Social History Main Topics  . Smoking status: Never Smoker   . Smokeless tobacco: Never Used  . Alcohol Use: 1.8 - 2.4 oz/week    3-4 Glasses of wine per week     Comment: wine 3-4 times per week  . Drug Use: No  . Sexual Activity: Yes    Birth Control/ Protection: Surgical   Other Topics Concern  . None   Social History Narrative     Review of Systems  Constitutional: Negative for fever, chills and appetite change.  HENT: Negative for congestion, ear pain, postnasal drip, sinus pressure and sore throat.   Eyes: Negative for pain and redness.  Respiratory: Negative for cough, shortness of breath and wheezing.   Cardiovascular: Negative for leg swelling.  Gastrointestinal: Negative for nausea, vomiting, abdominal pain, diarrhea, constipation and blood in stool.  Endocrine: Negative for polyuria.  Genitourinary: Negative for dysuria, urgency, frequency  and flank pain.  Musculoskeletal: Negative for gait problem.  Skin: Positive for rash.  Neurological: Negative for weakness and headaches.  Psychiatric/Behavioral: Negative for confusion and decreased concentration. The patient is not nervous/anxious.     Objective:  BP 130/80 mmHg  Pulse 85  Temp(Src) 98.2 F (36.8 C) (Oral)   Resp 18  Ht 5\' 5"  (1.651 m)  Wt 136 lb (61.689 kg)  BMI 22.63 kg/m2  SpO2 99%  LMP 09/27/2011  Physical Exam  Constitutional: She is oriented to person, place, and time. She appears well-developed and well-nourished. No distress.  HENT:  Head: Normocephalic and atraumatic.  Right Ear: External ear normal.  Left Ear: External ear normal.  Nose: Nose normal.  Eyes: Conjunctivae and EOM are normal. Pupils are equal, round, and reactive to light. No scleral icterus.  Neck: Normal range of motion. Neck supple. No tracheal deviation present.  Cardiovascular: Normal rate, regular rhythm and normal heart sounds.   Pulmonary/Chest: Effort normal. No respiratory distress. She has no wheezes. She has no rales.  Abdominal: She exhibits no mass. There is no tenderness. There is no rebound and no guarding.  Musculoskeletal: She exhibits no edema.  Lymphadenopathy:    She has no cervical adenopathy.  Neurological: She is alert and oriented to person, place, and time. Coordination normal.  Skin: Skin is warm and dry. Rash noted.  Psychiatric: She has a normal mood and affect. Her behavior is normal.   She has a rash on the left flank characteristic shingles    Assessment & Plan:   Amber Singleton was seen today for other.  Diagnoses and all orders for this visit:  Shingles  Other orders -     valACYclovir (VALTREX) 1000 MG tablet; Take 1 tablet (1,000 mg total) by mouth 2 (two) times daily.  I have discontinued Amber Singleton promethazine and HYDROmorphone. I am also having her start on valACYclovir. Additionally, I am having her maintain her DiphenhydrAMINE HCl (BENADRYL ALLERGY PO), tacrolimus, multivitamin with minerals, and Magnesium Malate.  Meds ordered this encounter  Medications  . valACYclovir (VALTREX) 1000 MG tablet    Sig: Take 1 tablet (1,000 mg total) by mouth 2 (two) times daily.    Dispense:  20 tablet    Refill:  0    Appropriate red flag conditions were discussed with the  patient as well as actions that should be taken.  Patient expressed his understanding.  Follow-up: Return if symptoms worsen or fail to improve.  Roselee Culver, MD

## 2015-07-26 NOTE — Patient Instructions (Signed)

## 2015-11-28 ENCOUNTER — Other Ambulatory Visit: Payer: Self-pay | Admitting: Orthopedic Surgery

## 2015-11-28 DIAGNOSIS — M5126 Other intervertebral disc displacement, lumbar region: Secondary | ICD-10-CM

## 2015-12-10 ENCOUNTER — Ambulatory Visit (INDEPENDENT_AMBULATORY_CARE_PROVIDER_SITE_OTHER): Payer: BLUE CROSS/BLUE SHIELD | Admitting: Neurology

## 2015-12-10 ENCOUNTER — Encounter: Payer: Self-pay | Admitting: Neurology

## 2015-12-10 VITALS — BP 106/60 | HR 68 | Resp 14 | Ht 64.75 in | Wt 125.0 lb

## 2015-12-10 DIAGNOSIS — Z8669 Personal history of other diseases of the nervous system and sense organs: Secondary | ICD-10-CM

## 2015-12-10 DIAGNOSIS — G35 Multiple sclerosis: Secondary | ICD-10-CM

## 2015-12-10 DIAGNOSIS — R269 Unspecified abnormalities of gait and mobility: Secondary | ICD-10-CM

## 2015-12-10 NOTE — Progress Notes (Signed)
[facility]  PATIENT: Amber Singleton DOB: [birth year]  REFERRING DOCTOR OR PCP:  Kelton Pillar SOURCE: patient, trecords form Dr. Laurann Montana  _________________________________   HISTORICAL  CHIEF COMPLAINT:  Chief Complaint  Patient presents with  . Multiple Sclerosis    Amber Singleton is here for eval of MS.  Sts.  she was dx. around 1989.  Presenting sx. was optic neuritis (left eye), and fatigue.   Sts. dx. confirmed with MRI.  No LP.   Sts. she initially started Betaseron, but stopped after a few mos. due to side effects (flu like sx).  She has not been on any MS med since--just occasional IV steroids for exacerbations of sx.  She has been lost to f/u for 4-5 years, and is here to re-establish care.  She is currently being eval for lbp radiating down front of right leg to   . Back Pain    knee.  She had Prednisone taper pack for back pain last week and sts. it helped some.  She  has an MRI sched. for Monday, April 3./fim    HISTORY OF PRESENT ILLNESS:  I had the pleasure of seeing your patient, Amber Singleton, at [facility] for neurologic consultation regarding her multiple sclerosis. She was diagnosed with MS in 1989 after presenting with left optic neuritis and having an MRI of the brain consistent with the disease.  MS history: She presented with left visual blurring and left eye pain in 1989. Her ophthalmologist told her that she might have multiple sclerosis and referred her for an MRI. The MRI showed changes consistent with MS and she was referred to Dr. Mervyn Skeeters at [facility]. He diagnosed multiple sclerosis. At that time, there was not any FDA approved medication. In 1993, she had a second episode of optic neuritis. A year or so later she had a [place] sign when she would bend her neck with a zinging sensation down into her abdomen. She received a course of IV steroids for those symptoms. She then was started on Betaseron but had difficulty tolerating  the medication. She was only on it for about 3 months before stopping due to the side effects. She has not gone on any disease modifying therapy since the mid 90s. She has had several MRIs of the brain and cervical spine since then. The MRIs of the brain were reportedly unchanged. The MRI of the cervical spine was also reportedly unchanged.  I personally reviewed the MRI of the brain and cervical spine from 05/11/2009 and the MRI of the thoracic spine from 09/26/2010. The MRI of the brain shows multiple T2/FLAIR hyperintense foci in the periventricular white matter consistent with multiple sclerosis. Many of them are radially oriented to the ventricles. She does not have any plaques in the brainstem or cerebellum. The MRI of the cervical spine shows 2 foci, one around C3-C4 and one at C6-C7 in the posterior columns. None of the foci on the brain or spine appeared to be acute. The MRI of the thoracic spine showed a normal spinal cord.  Gait/strength/sensation: Currently,  she notes that her gait is normal. She is able to climb a ladder and right a bike without difficulty. Once, she feels her balance is slightly off. She denies any weakness. She does note a little bit of numbness in the right leg but also notes that the distribution is similar to where she will get radicular pain. She has known degenerative changes in her lumbar spine.  Vision: She continues to  have mild blurry vision out of the left eye at times. She denies any eye pain or diplopia.  She has not noted any change in color vision.  Bladder: She gets occasional urgency. In the past she was diagnosed with interstitial cystitis and occasionally gets burning bladder pain and bladder spasms. She has rare stress incontinence. This is usually mild.  Fatigue/sleep: She does note some fatigue now and then but she feels she generally feels similar to friend of the same age. She notes that she has had more insomnia over the past year as she is going  through menopause and has hot flashes at night.  Mood/cognition: She denies any depression or anxiety. She feels there are no major cognitive problems though she does have some word finding difficulties with decreased focus at times.   REVIEW OF SYSTEMS: Constitutional: No fevers, chills, sweats, or change in appetite.   Insomnia Eyes: No visual changes, double vision, eye pain Ear, nose and throat: No hearing loss, ear pain, nasal congestion, sore throat Cardiovascular: No chest pain, palpitations Respiratory: No shortness of breath at rest or with exertion.   No wheezes GastrointestinaI: No nausea, vomiting, diarrhea, abdominal pain, fecal incontinence Genitourinary: No dysuria, urinary retention or frequency.  No nocturia. Musculoskeletal: Notes back pain Integumentary: No rash, pruritus, skin lesions Neurological: as above Psychiatric: No depression at this time.  No anxiety Endocrine: No palpitations, diaphoresis, change in appetite, change in weigh or increased thirst Hematologic/Lymphatic: No anemia, purpura, petechiae. Allergic/Immunologic: No itchy/runny eyes, nasal congestion, recent allergic reactions, rashes  ALLERGIES: Allergies  Allergen Reactions  . Penicillins Anaphylaxis    Childhood reaction.  . Codeine Nausea Only  . Darvocet [Propoxyphene N-Acetaminophen] Nausea Only  . Sulfur     Leg pain    HOME MEDICATIONS:  Current outpatient prescriptions:  .  DiphenhydrAMINE HCl (BENADRYL ALLERGY PO), Take 1 tablet by mouth daily as needed. For allergies., Disp: , Rfl:  .  Magnesium Malate POWD, 833 mg by Does not apply route daily., Disp: , Rfl:  .  Multiple Vitamins-Minerals (MULTIVITAMIN WITH MINERALS) tablet, Take 1 tablet by mouth daily., Disp: , Rfl:  .  tacrolimus (PROTOPIC) 0.1 % ointment, Apply topically as needed. , Disp: , Rfl:   PAST MEDICAL HISTORY: Past Medical History  Diagnosis Date  . IBS (irritable bowel syndrome)   . IC (interstitial  cystitis)   . MS (multiple sclerosis) ([place])   . Neuromuscular disorder ([place])     MS  . Hyperlipidemia     tx with diet  . Environmental allergies   . Bursitis of hip     Bil  . Post-operative nausea and vomiting   . Allergy     PAST SURGICAL HISTORY: Past Surgical History  Procedure Laterality Date  . Cesarean section      x 2  . Tubal ligation    . Breast surgery      breast augmentation  . Novasure ablation  12/2007  . Dilation and curettage of uterus  12/2007  . Liposuction multiple body parts    . Wisdom tooth extraction    . Upper gastrointestinal endoscopy    . Colonscopy      x 2  . Abdominal hysterectomy    . Rhinoplasty    . Knee arthroscopy with medial menisectomy Right 05/29/2015    Procedure: RIGHT KNEE ARTHROSCOPY WITH PARTIAL MEDIAL MENISECTOMY;  Surgeon: Dorna Leitz, MD;  Location: [facility];  Service: Orthopedics;  Laterality: Right;  . Chondroplasty Right 05/29/2015  Procedure: CHONDROPLASTY MEDIAL FEMORAL CONDYLE AND PATELLOFEMORAL JOINT;  Surgeon: Dorna Leitz, MD;  Location: [facility];  Service: Orthopedics;  Laterality: Right;  . Knee arthroscopy with excision plica Right 5/85/9292    Procedure: MEDIAL SHELF PLICA EXCISION;  Surgeon: Dorna Leitz, MD;  Location: [facility];  Service: Orthopedics;  Laterality: Right;    FAMILY HISTORY: Family History  Problem Relation Age of Onset  . Breast cancer Mother   . Colon polyps Mother   . Lymphoma Father   . Diabetes Brother   . Colon cancer Maternal Grandmother   . Colon cancer Maternal Grandfather   . Stomach cancer Neg Hx     SOCIAL HISTORY:  Social History   Social History  . Marital Status: Married    Spouse Name: N/A  . Number of Children: N/A  . Years of Education: N/A   Occupational History  . Not on file.   Social History Main Topics  . Smoking status: Never Smoker   . Smokeless tobacco: Never Used  . Alcohol Use: 1.8 - 2.4 oz/week     3-4 Glasses of wine per week     Comment: wine 3-4 times per week  . Drug Use: No  . Sexual Activity: Yes    Birth Control/ Protection: Surgical   Other Topics Concern  . Not on file   Social History Narrative     PHYSICAL EXAM  Filed Vitals:   12/10/15 1529  BP: 106/60  Pulse: 68  Resp: 14  Height: 5' 4.75" (1.645 m)  Weight: 125 lb (56.7 kg)    Body mass index is 20.95 kg/(m^2).   General: The patient is well-developed and well-nourished and in no acute distress  Eyes:  Funduscopic exam shows normal optic discs and retinal vessels.  Neck: The neck is supple, no carotid bruits are noted.  The neck is nontender.  Cardiovascular: The heart has a regular rate and rhythm with a normal S1 and S2. There were no murmurs, gallops or rubs. Lungs are clear to auscultation.  Skin: Extremities are without significant edema.  Musculoskeletal:  Back is nontender  Neurologic Exam  Mental status: The patient is alert and oriented x 3 at the time of the examination. The patient has apparent normal recent and remote memory, with an apparently normal attention span and concentration ability.   Speech is normal.  Cranial nerves: Extraocular movements are full. Pupils show 1+ left APD.  Visual fields are full. Color vision is symmetric Facial symmetry is present. There is good facial sensation to soft touch bilaterally.Facial strength is normal.  Trapezius and sternocleidomastoid strength is normal. No dysarthria is noted.  The tongue is midline, and the patient has symmetric elevation of the soft palate. No obvious hearing deficits are noted.  Motor:  Muscle bulk is normal.   Tone is normal. Strength is  5 / 5 in all 4 extremities.   Sensory: Sensory testing is intact to pinprick, soft touch and vibration sensation in all 4 extremities.  Coordination: Cerebellar testing reveals good finger-nose-finger and heel-to-shin bilaterally.  Gait and station: Station is normal.   Gait is  normal. Tandem gait is slightly wide. Romberg is negative.   Reflexes: Deep tendon reflexes are symmetric and normal bilaterally in arms but increased in legs with spread at the knees.   Plantar responses are flexor.    DIAGNOSTIC DATA (LABS, IMAGING, TESTING) - I reviewed patient records, labs, notes, testing and imaging myself where available.  Lab Results  Component Value Date   WBC 12.5* 10/24/2011   HGB 12.8 10/24/2011   HCT 38.0 10/24/2011   MCV 92.2 10/24/2011   PLT 213 10/24/2011      Component Value Date/Time   NA 137 12/20/2007 0732   K 3.6 12/20/2007 0732   CL 104 12/20/2007 0732   CO2 26 12/20/2007 0732   GLUCOSE 123* 12/20/2007 0732   BUN 7 12/20/2007 0732   CREATININE 0.78 12/20/2007 0732   CALCIUM 9.2 12/20/2007 0732   GFRNONAA >60 12/20/2007 0732   GFRAA  12/20/2007 0732    >60        The eGFR has been calculated using the MDRD equation. This calculation has not been validated in all clinical       ASSESSMENT AND PLAN  Multiple sclerosis ([place]) - Plan: MR Brain W Wo Contrast  Gait disturbance - Plan: MR Brain W Wo Contrast  History of optic neuritis - Plan: MR Brain W Wo Contrast   In summary, Amber Singleton is a 55 year old woman with multiple sclerosis who has done fairly well over the past 27 years since her diagnosis in 1989. Currently, on examination, she has mildly wide tandem gait and a mild age. Pupillary defect and mild hyperreflexia in the legs. She actually looks remarkably well for somebody who has long-standing MS and plaques in her spinal cord.    She has not been on therapy since the mid 1990s. We discussed that generally I like my patients with multiple sclerosis to be on a disease modifying therapy. However, she has clearly done well clinically without being on 1 for many years. At this point, I would like to check an MRI of the brain and compared to the MRI from 2010. If there had been significant changes in the interim, I would  strongly urge her to get on a disease modifying therapy and I would likely recommend Aubagio as it is a well-tolerated pill with a good safety record but would review the rest of the disease modifying therapies since it is made.   On the other hand, if the MRI of the brain shows no changes or just one or 2 more small T2/FLAIR hyperintense foci since 2010, then it would be reasonable for her to remain off therapy and have an MRI of the 2 years or so to rule out future subclinical progression.   She is in agreement with the plan.  She has some insomnia, mostly due to hot flashes. Discussed with her that medication like gabapentin at bedtime only might help the insomnia and be well-tolerated but she would prefer to hold off at this point.  She will return to see me in 6 months or call sooner if she has any new or worsening neurologic symptoms. She will also call if the insomnia worsens.    Richard A. Felecia Shelling, MD, PhD 9/47/6546, 5:03 PM Certified in Neurology, Clinical Neurophysiology, Sleep Medicine, Pain Medicine and Neuroimaging  [facility] [address] [place], [place] [address] [phone]

## 2015-12-16 ENCOUNTER — Ambulatory Visit
Admission: RE | Admit: 2015-12-16 | Discharge: 2015-12-16 | Disposition: A | Discharge status: Home / Self Care | Payer: BLUE CROSS/BLUE SHIELD | Source: Ambulatory Visit | Attending: Orthopedic Surgery | Admitting: Orthopedic Surgery

## 2015-12-16 DIAGNOSIS — M5126 Other intervertebral disc displacement, lumbar region: Secondary | ICD-10-CM

## 2015-12-24 ENCOUNTER — Other Ambulatory Visit: Payer: BLUE CROSS/BLUE SHIELD

## 2016-01-02 ENCOUNTER — Inpatient Hospital Stay: Admission: RE | Admit: 2016-01-02 | Payer: BLUE CROSS/BLUE SHIELD | Source: Ambulatory Visit

## 2016-01-09 ENCOUNTER — Inpatient Hospital Stay: Admission: RE | Admit: 2016-01-09 | Payer: BLUE CROSS/BLUE SHIELD | Source: Ambulatory Visit

## 2016-01-27 ENCOUNTER — Other Ambulatory Visit: Payer: BLUE CROSS/BLUE SHIELD

## 2016-06-05 ENCOUNTER — Ambulatory Visit: Payer: BLUE CROSS/BLUE SHIELD | Admitting: Neurology

## 2016-08-10 ENCOUNTER — Other Ambulatory Visit: Payer: Self-pay | Admitting: Family Medicine

## 2016-08-10 DIAGNOSIS — R0989 Other specified symptoms and signs involving the circulatory and respiratory systems: Secondary | ICD-10-CM | POA: Diagnosis not present

## 2016-08-12 ENCOUNTER — Ambulatory Visit
Admission: RE | Admit: 2016-08-12 | Discharge: 2016-08-12 | Disposition: A | Discharge status: Home / Self Care | Payer: BLUE CROSS/BLUE SHIELD | Source: Ambulatory Visit | Attending: Family Medicine | Admitting: Family Medicine

## 2016-08-12 DIAGNOSIS — R0989 Other specified symptoms and signs involving the circulatory and respiratory systems: Secondary | ICD-10-CM

## 2016-08-12 DIAGNOSIS — J329 Chronic sinusitis, unspecified: Secondary | ICD-10-CM | POA: Diagnosis not present

## 2016-08-26 ENCOUNTER — Telehealth: Payer: Self-pay | Admitting: Neurology

## 2016-08-26 ENCOUNTER — Other Ambulatory Visit: Payer: Self-pay | Admitting: *Deleted

## 2016-08-26 DIAGNOSIS — G35 Multiple sclerosis: Secondary | ICD-10-CM

## 2016-08-26 NOTE — Telephone Encounter (Signed)
New MRI brain order placed in EPIC/fim

## 2016-08-26 NOTE — Telephone Encounter (Signed)
Patient is calling to get an order for an MRI. She says she is now able to have it. She has an appointment with Dr. Felecia Shelling on 09-23-16 and would like to have the MRI before that  appointment.

## 2016-09-01 MED ORDER — ALPRAZOLAM 0.5 MG PO TABS: ORAL_TABLET | ORAL | 0 refills | Status: DC

## 2016-09-01 NOTE — Telephone Encounter (Signed)
I have spoken with Amber Singleton this afternoon.  She sts. she has Diazepam from a 2016 rx. of her husband's.  I have advised RAS will send something in for her claustrophobia prior to MRI.  She should not take her husband's med.  She verbalized understanding of same.  Alprazolam rx. awaiting RAS sig/fim

## 2016-09-01 NOTE — Telephone Encounter (Signed)
Patient returning call to schedule an MRI.

## 2016-09-01 NOTE — Telephone Encounter (Signed)
I called the patient on 08/31/16 to schedule the MRI she didn't answer I left a voicemail

## 2016-09-01 NOTE — Addendum Note (Signed)
Addended by: France Ravens I on: 09/01/2016 04:43 PM   Modules accepted: Orders

## 2016-09-01 NOTE — Telephone Encounter (Signed)
Pt called says she has diazepam 5mg  but she doesn't know how to take it prior to MRI. She can be reached at [phone]

## 2016-09-01 NOTE — Telephone Encounter (Signed)
Patient is very claustrophobia and she's going to [facility] because she wants to go to a open MRI. Faxed order today

## 2016-09-01 NOTE — Telephone Encounter (Signed)
Alpraolam rx. faxed to [facility]/fim

## 2016-09-03 DIAGNOSIS — G35 Multiple sclerosis: Secondary | ICD-10-CM | POA: Diagnosis not present

## 2016-09-10 DIAGNOSIS — G35 Multiple sclerosis: Secondary | ICD-10-CM | POA: Diagnosis not present

## 2016-09-11 ENCOUNTER — Ambulatory Visit (INDEPENDENT_AMBULATORY_CARE_PROVIDER_SITE_OTHER): Payer: Self-pay

## 2016-09-11 DIAGNOSIS — Z0289 Encounter for other administrative examinations: Secondary | ICD-10-CM

## 2016-09-11 DIAGNOSIS — G35 Multiple sclerosis: Secondary | ICD-10-CM

## 2016-09-16 ENCOUNTER — Telehealth: Payer: Self-pay | Admitting: *Deleted

## 2016-09-16 NOTE — Telephone Encounter (Signed)
-----   Message from Britt Bottom, MD sent at 09/16/2016  9:35 AM EST ----- Please let her know that the MRI was unchanged from her previous one.

## 2016-09-16 NOTE — Telephone Encounter (Signed)
LMTC./fim 

## 2016-09-16 NOTE — Telephone Encounter (Signed)
Noted/fim 

## 2016-09-16 NOTE — Telephone Encounter (Signed)
Pt returned RN's call, msg relayed, she said "great"and had no questions

## 2016-09-16 NOTE — Telephone Encounter (Signed)
I have spoken with Amber Singleton this afternoon and per RAS, explained that MRI showed no changes when compared with her previous MRI.  She verbalized understanding of same/fim

## 2016-09-18 DIAGNOSIS — J069 Acute upper respiratory infection, unspecified: Secondary | ICD-10-CM | POA: Diagnosis not present

## 2016-09-23 ENCOUNTER — Encounter: Payer: Self-pay | Admitting: Neurology

## 2016-09-23 ENCOUNTER — Ambulatory Visit (INDEPENDENT_AMBULATORY_CARE_PROVIDER_SITE_OTHER): Payer: BLUE CROSS/BLUE SHIELD | Admitting: Neurology

## 2016-09-23 VITALS — BP 112/70 | HR 68 | Resp 6 | Ht 64.75 in | Wt 133.0 lb

## 2016-09-23 DIAGNOSIS — G8929 Other chronic pain: Secondary | ICD-10-CM | POA: Insufficient documentation

## 2016-09-23 DIAGNOSIS — R269 Unspecified abnormalities of gait and mobility: Secondary | ICD-10-CM

## 2016-09-23 DIAGNOSIS — G35 Multiple sclerosis: Secondary | ICD-10-CM | POA: Diagnosis not present

## 2016-09-23 DIAGNOSIS — Z8669 Personal history of other diseases of the nervous system and sense organs: Secondary | ICD-10-CM

## 2016-09-23 DIAGNOSIS — M545 Low back pain, unspecified: Secondary | ICD-10-CM | POA: Insufficient documentation

## 2016-09-23 MED ORDER — MELOXICAM 7.5 MG PO TABS
7.5000 mg | ORAL_TABLET | Freq: Every day | ORAL | 11 refills | Status: DC
Start: 2016-09-23 — End: 2020-11-20

## 2016-09-23 MED ORDER — CYCLOBENZAPRINE HCL 5 MG PO TABS: 5.0000 mg | ORAL_TABLET | Freq: Every day | ORAL | 11 refills | Status: DC

## 2016-09-23 NOTE — Progress Notes (Signed)
[facility]  PATIENT: Amber Singleton DOB: 12/28/1960  REFERRING DOCTOR OR PCP:  Kelton Pillar SOURCE: patient, trecords form Dr. Laurann Montana  _________________________________   HISTORICAL  CHIEF COMPLAINT:  Chief Complaint  Patient presents with  . Multiple Sclerosis    She is not currenlty on any dz. modifying therapy.  Denies new or worsening sx.  Last MRI showed no new changes./fim    HISTORY OF PRESENT ILLNESS:  I had the pleasure of seeing your patient, Amber Singleton, at [facility] for neurologic consultation regarding her multiple sclerosis.   MS:   She was diagnosed with MS in 1989 after presenting with left optic neuritis and having an MRI of the brain consistent with the disease.  She's been off disease modifying therapies for many years. At the last visit, I checked an MRI of the brain and compared it and MRI from 2010. I showed her the actual MRI images. Some white matter foci, predominantly periventricular, consistent with multiple sclerosis. None of the foci appeared to be acute. She has no atrophy. When compared to the 2010 MRI, there is no interval change. We have discussed that even though I generally prefer my patients with MS to be on a disease modifying therapy, since she has gone so long without any changes on MRI and without any new clinical symptoms, that we can continue her off of a disease modifying therapy and check an MRI of the brain every couple years to make sure that there is no subclinical progression.  Neck pain/back pain:   She reports neck > back pain.   Earlier in 2017, she had more LBP and right leg pain and MRI showed a right lateral L3L4 disc herniation with some impingement on the right L3.    Neck pain is axial without radiation in to and arm.   If more sever, she gets headaches.  MRI of the cervical spine in 2010 did not show much DJD/DDD.      Gait/strength/sensation: Gait is fairly normal. She is able  to climb a ladder and right a bike without difficulty. Rarely, she feels her balance is slightly off. She denies any weakness. She does note a little bit of numbness in the right leg but also notes that the distribution is similar to where she will get radicular pain. She has known degenerative changes in her lumbar spine.  Vision: She has mild blurry vision out of the left eye at times. She denies any eye pain or diplopia.  She has not noted any change in color vision.  Bladder: She gets occasional urgency. In the past she was diagnosed with interstitial cystitis and occasionally gets burning bladder pain and bladder spasms. She has rare stress incontinence.    Fatigue/sleep: She does note some fatigue but she feels she generally feels similar to friend of the same age. She notes that she has had more insomnia over the past year as she is going through menopause and has hot flashes at night.  Mood/cognition: She denies any depression or anxiety. She feels there are no major cognitive problems though she does have some word finding difficulties with decreased focus at times.  MS history: She presented with left visual blurring and left eye pain in 1989. Her ophthalmologist told her that she might have multiple sclerosis and referred her for an MRI. The MRI showed changes consistent with MS and she was referred to Dr. Mervyn Skeeters at [facility] He diagnosed multiple sclerosis. At that time, there was  not any FDA approved medication. In 1993, she had a second episode of optic neuritis. A year or so later she had a [place] sign when she would bend her neck with a zinging sensation down into her abdomen. She received a course of IV steroids for those symptoms. She then was started on Betaseron but had difficulty tolerating the medication. She was only on it for about 3 months before stopping due to the side effects. She has not gone on any disease modifying therapy since the mid 90s. She has had several MRIs of the brain  and cervical spine since then which were unchanged.   An MRI of the brain performed 09/11/2016 showed no new lesions compared to an MRI from 2010. Also of note, she has no atrophy. She does have 15-20 periventricular foci with Dawson's fingers.   She does not have any plaques in the brainstem or cerebellum. The MRI of the cervical spine from 2010 shows 2 foci, one around C3-C4 and one at C6-C7 in the posterior columns. None of the foci on the brain or spine appeared to be acute. The MRI of the thoracic spine from 2012 showed a normal spinal cord.    REVIEW OF SYSTEMS: Constitutional: No fevers, chills, sweats, or change in appetite.   Insomnia Eyes: No visual changes, double vision, eye pain Ear, nose and throat: No hearing loss, ear pain, nasal congestion, sore throat Cardiovascular: No chest pain, palpitations Respiratory: No shortness of breath at rest or with exertion.   No wheezes GastrointestinaI: No nausea, vomiting, diarrhea, abdominal pain, fecal incontinence Genitourinary: No dysuria, urinary retention or frequency.  No nocturia. Musculoskeletal: Notes back pain Integumentary: No rash, pruritus, skin lesions Neurological: as above Psychiatric: No depression at this time.  No anxiety Endocrine: No palpitations, diaphoresis, change in appetite, change in weigh or increased thirst Hematologic/Lymphatic: No anemia, purpura, petechiae. Allergic/Immunologic: No itchy/runny eyes, nasal congestion, recent allergic reactions, rashes  ALLERGIES: Allergies  Allergen Reactions  . Penicillins Anaphylaxis    Childhood reaction.  . Codeine Nausea Only  . Darvocet [Propoxyphene N-Acetaminophen] Nausea Only  . Sulfur     Leg pain    HOME MEDICATIONS:  Current Outpatient Prescriptions:  .  ALPRAZolam (XANAX) 0.5 MG tablet, Take one tablet 30 minutes prior to MRI.  Take 2nd tablet to the MRI appt. and take it if needed.  You must not drive, drink alcohol, or operate dangerous equipment  while taking this medication., Disp: 2 tablet, Rfl: 0 .  DiphenhydrAMINE HCl (BENADRYL ALLERGY PO), Take 1 tablet by mouth daily as needed. For allergies., Disp: , Rfl:  .  Magnesium Malate POWD, 833 mg by Does not apply route daily., Disp: , Rfl:  .  Multiple Vitamins-Minerals (MULTIVITAMIN WITH MINERALS) tablet, Take 1 tablet by mouth daily., Disp: , Rfl:  .  tacrolimus (PROTOPIC) 0.1 % ointment, Apply topically as needed. , Disp: , Rfl:  .  cyclobenzaprine (FLEXERIL) 5 MG tablet, Take 1 tablet (5 mg total) by mouth at bedtime., Disp: 30 tablet, Rfl: 11 .  fluticasone (FLONASE) 50 MCG/ACT nasal spray, , Disp: , Rfl:  .  meloxicam (MOBIC) 7.5 MG tablet, Take 1 tablet (7.5 mg total) by mouth daily., Disp: 30 tablet, Rfl: 11  PAST MEDICAL HISTORY: Past Medical History:  Diagnosis Date  . Allergy   . Bursitis of hip    Bil  . Environmental allergies   . Hyperlipidemia    tx with diet  . IBS (irritable bowel syndrome)   . IC (  interstitial cystitis)   . MS (multiple sclerosis) ([place])   . Neuromuscular disorder ([place])    MS  . Post-operative nausea and vomiting     PAST SURGICAL HISTORY: Past Surgical History:  Procedure Laterality Date  . ABDOMINAL HYSTERECTOMY    . BREAST SURGERY     breast augmentation  . CESAREAN SECTION     x 2  . CHONDROPLASTY Right 05/29/2015   Procedure: CHONDROPLASTY MEDIAL FEMORAL CONDYLE AND PATELLOFEMORAL JOINT;  Surgeon: Dorna Leitz, MD;  Location: [place];  Service: Orthopedics;  Laterality: Right;  . colonscopy     x 2  . DILATION AND CURETTAGE OF UTERUS  12/2007  . KNEE ARTHROSCOPY WITH EXCISION PLICA Right 7/86/7544   Procedure: MEDIAL SHELF PLICA EXCISION;  Surgeon: Dorna Leitz, MD;  Location: [place];  Service: Orthopedics;  Laterality: Right;  . KNEE ARTHROSCOPY WITH MEDIAL MENISECTOMY Right 05/29/2015   Procedure: RIGHT KNEE ARTHROSCOPY WITH PARTIAL MEDIAL MENISECTOMY;  Surgeon: Dorna Leitz, MD;  Location: [place];  Service: Orthopedics;  Laterality: Right;  . LIPOSUCTION MULTIPLE BODY PARTS    . NOVASURE ABLATION  12/2007  . RHINOPLASTY    . TUBAL LIGATION    . UPPER GASTROINTESTINAL ENDOSCOPY    . WISDOM TOOTH EXTRACTION      FAMILY HISTORY: Family History  Problem Relation Age of Onset  . Breast cancer Mother   . Colon polyps Mother   . Lymphoma Father   . Diabetes Brother   . Colon cancer Maternal Grandmother   . Colon cancer Maternal Grandfather   . Stomach cancer Neg Hx     SOCIAL HISTORY:  Social History   Social History  . Marital status: Married    Spouse name: N/A  . Number of children: N/A  . Years of education: N/A   Occupational History  . Not on file.   Social History Main Topics  . Smoking status: Never Smoker  . Smokeless tobacco: Never Used  . Alcohol use 1.8 - 2.4 oz/week    3 - 4 Glasses of wine per week     Comment: wine 3-4 times per week  . Drug use: No  . Sexual activity: Yes    Birth control/ protection: Surgical   Other Topics Concern  . Not on file   Social History Narrative  . No narrative on file     PHYSICAL EXAM  Vitals:   09/23/16 1058  BP: 112/70  Pulse: 68  Resp: (!) 6  Weight: 133 lb (60.3 kg)  Height: 5' 4.75" (1.645 m)    Body mass index is 22.3 kg/m.   General: The patient is well-developed and well-nourished and in no acute distress   Neck: The neck is supple, no carotid bruits are noted.  The neck is mildly tender.   Skin: Extremities are without rash or edema.    Neurologic Exam  Mental status: The patient is alert and oriented x 3 at the time of the examination. The patient has apparent normal recent and remote memory, with an apparently normal attention span and concentration ability.   Speech is normal.  Cranial nerves: Extraocular movements are full. Pupils show 1+ left APD.  There is good facial sensation to soft touch bilaterally.Facial strength is normal.  Trapezius and  sternocleidomastoid strength is normal. No dysarthria is noted.  The tongue is midline, and the patient has symmetric elevation of the soft palate. No obvious hearing deficits are noted.  Motor:  Muscle  bulk is normal.   Tone is normal. Strength is  5 / 5 in all 4 extremities.   Sensory: Sensory testing is intact to pinprick, soft touch and vibration sensation in all 4 extremities.  Coordination: Cerebellar testing reveals good finger-nose-finger and heel-to-shin bilaterally.  Gait and station: Station is normal.   Gait is normal. Tandem gait is slightly wide. Romberg is negative.   Reflexes: Deep tendon reflexes are symmetric and normal bilaterally in arms but increased in legs with spread at the knees.         DIAGNOSTIC DATA (LABS, IMAGING, TESTING) - I reviewed patient records, labs, notes, testing and imaging myself where available.  Lab Results  Component Value Date   WBC 12.5 (H) 10/24/2011   HGB 12.8 10/24/2011   HCT 38.0 10/24/2011   MCV 92.2 10/24/2011   PLT 213 10/24/2011      Component Value Date/Time   NA 137 12/20/2007 0732   K 3.6 12/20/2007 0732   CL 104 12/20/2007 0732   CO2 26 12/20/2007 0732   GLUCOSE 123 (H) 12/20/2007 0732   BUN 7 12/20/2007 0732   CREATININE 0.78 12/20/2007 0732   CALCIUM 9.2 12/20/2007 0732   GFRNONAA >60 12/20/2007 0732   GFRAA  12/20/2007 0732    >60        The eGFR has been calculated using the MDRD equation. This calculation has not been validated in all clinical       ASSESSMENT AND PLAN  Multiple sclerosis ([place])  Gait disturbance  History of optic neuritis  Chronic midline low back pain without sciatica     1.   Mrs Bhavsar has long-standing MS. Her recent MRI of the brain did not show any new findings compared to 2010 and she has no atrophy. She does appear to have a milder than typical MS we discussed options. He would like to remain off of a disease modifying therapy. She is willing to go on one if she has an  exacerbation or if her next MRI (we'll probably check in about 2 years) shows any new changes. 2.    Flexeril daily at bedtime to help sleep and spasticity.   Meloxicam for back pain. 3.   Stay active and exercises as tolerated. 4.   She will return to see me in one year but call sooner if she has new or worsening neurologic symptoms.   Charnita Trudel A. Felecia Shelling, MD, PhD 9/37/1696, 7:89 PM Certified in Neurology, Clinical Neurophysiology, Sleep Medicine, Pain Medicine and Neuroimaging  [facility] [address], [place], [place] [address] [phone]

## 2016-11-19 DIAGNOSIS — J029 Acute pharyngitis, unspecified: Secondary | ICD-10-CM | POA: Diagnosis not present

## 2016-12-23 IMAGING — CT CT HEAD W/O CM
2 series · 16 of 30 positions shown, 18 images · non-contrast
Comparison: Head CT scan 03/13/2013.

CLINICAL DATA: Blow to the head 05/13/2015. Dizziness and headache.
Initial encounter.

EXAM:
CT HEAD WITHOUT CONTRAST
TECHNIQUE: Contiguous axial images were obtained from the base of the skull
through the vertex without intravenous contrast.

[Series 3: head bone · axial · 0.49mm/px · z∈[+23,+148]mm · 8 of 64 slices shown]
[im 7/64  bone]
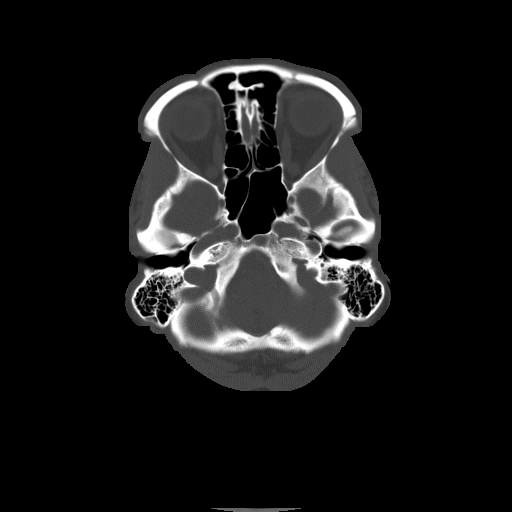
[im 14/64  bone]
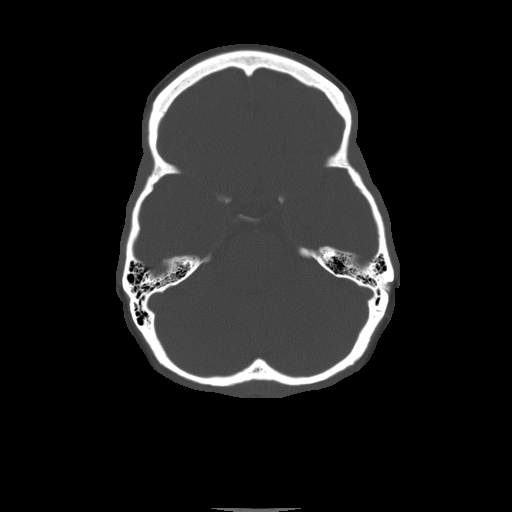
[im 20/64  bone]
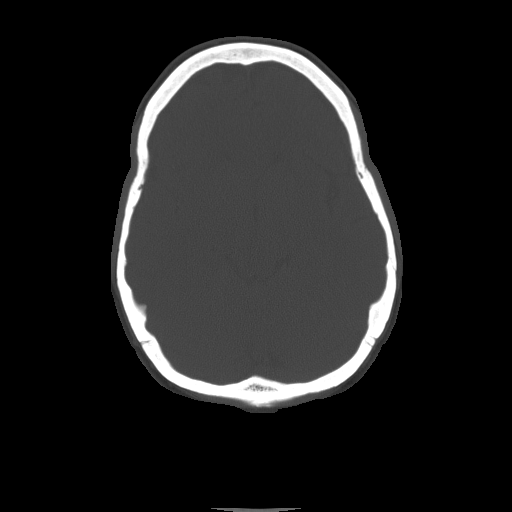
[im 27/64  bone]
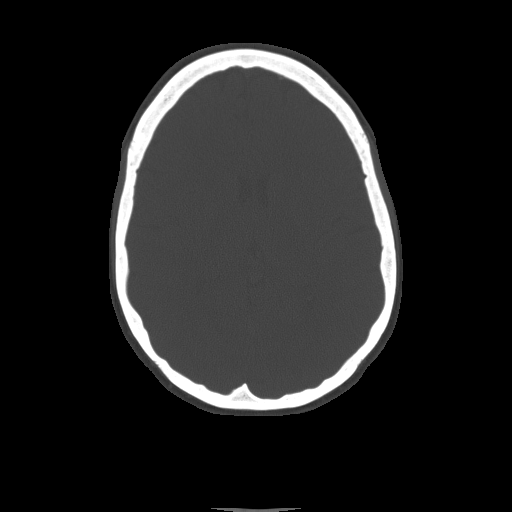
[im 37/64  bone]
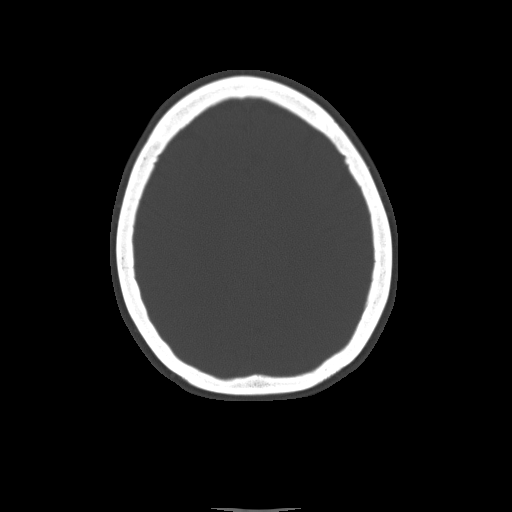
[im 44/64  bone]
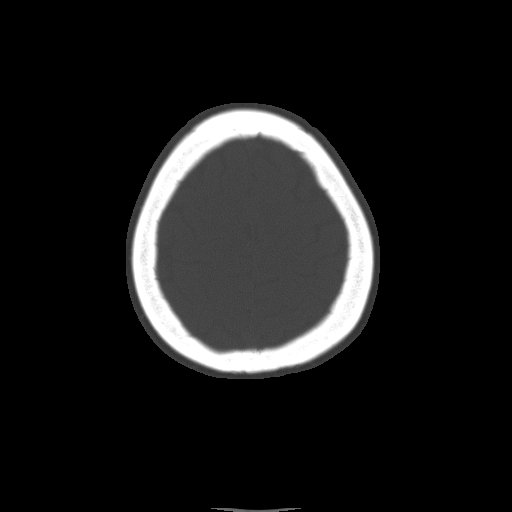
[im 50/64  bone]
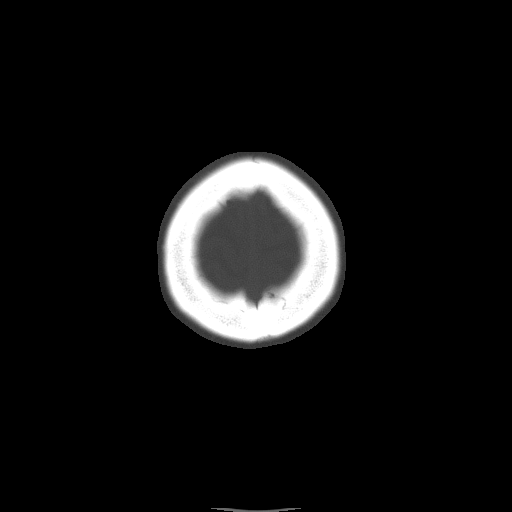
[im 57/64  bone]
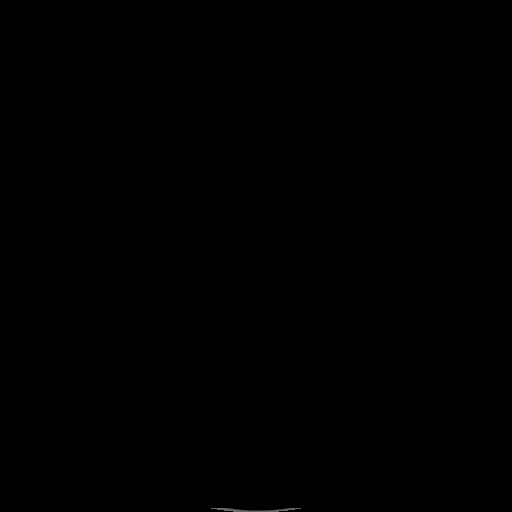

[Series 32: 3d filtered head w/o · axial · non-contrast · 0.49mm/px · z∈[+24,+144]mm · 8 of 32 slices shown, 10 images]
[im 4/32  brain]
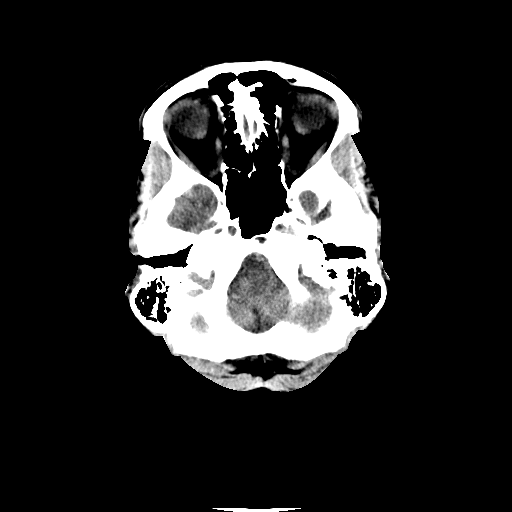
[im 4/32  bone]
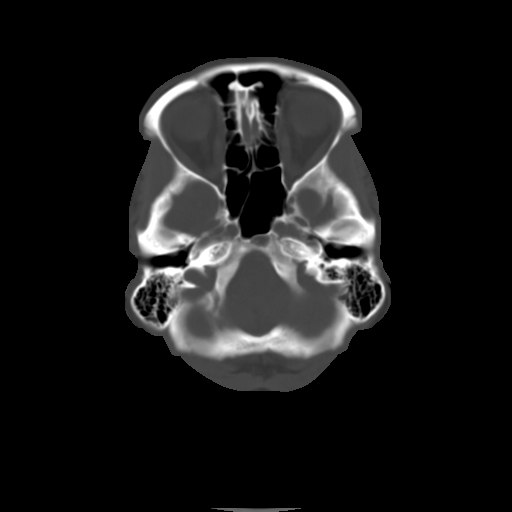
[im 7/32  brain]
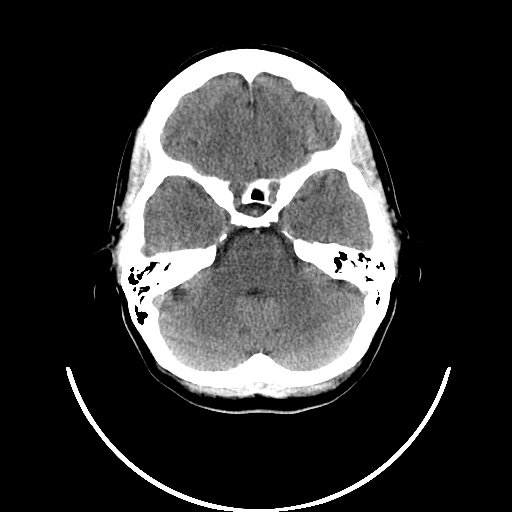
[im 11/32  brain]
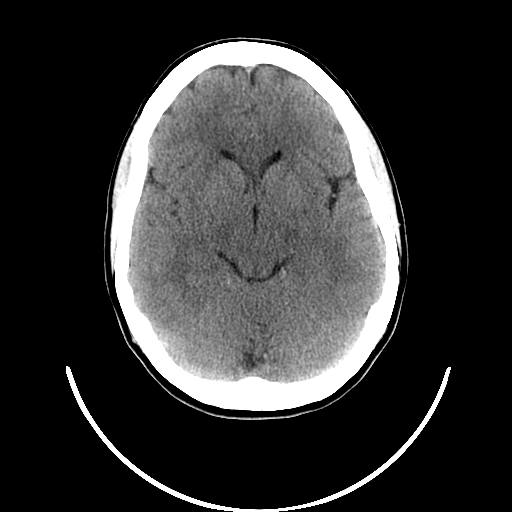
[im 14/32  brain]
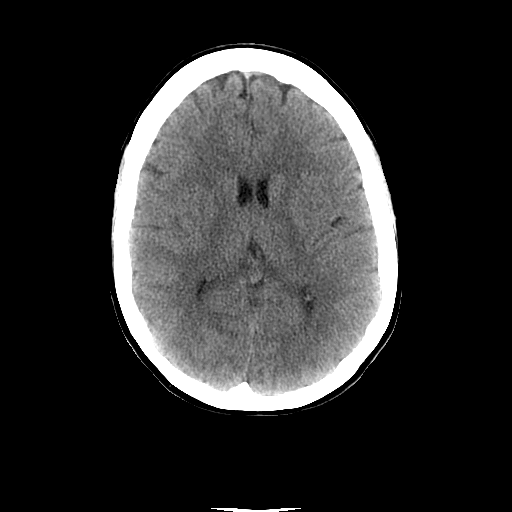
[im 18/32  brain]
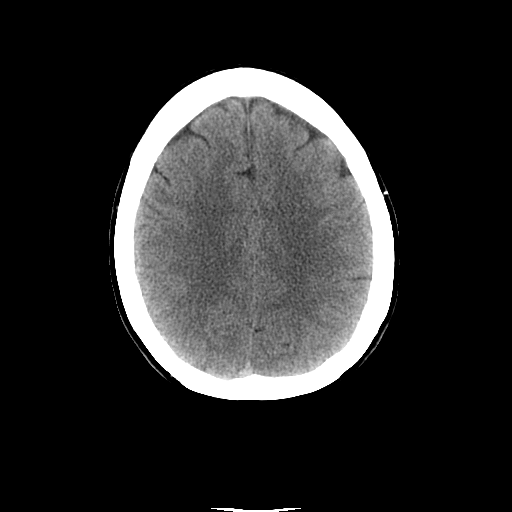
[im 18/32  bone]
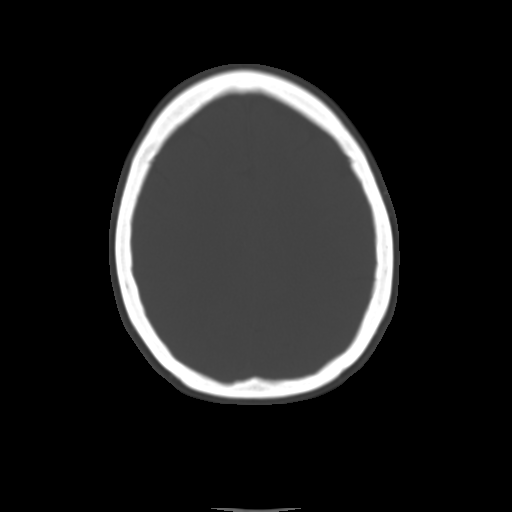
[im 21/32  brain]
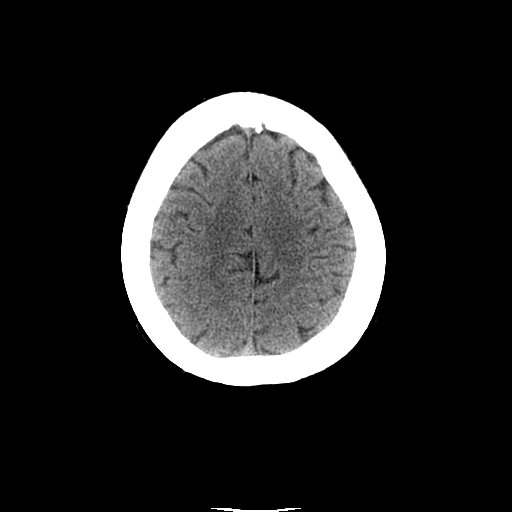
[im 25/32  brain]
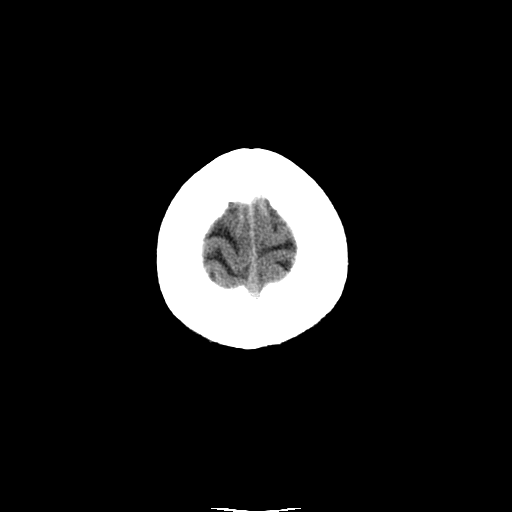
[im 28/32  brain]
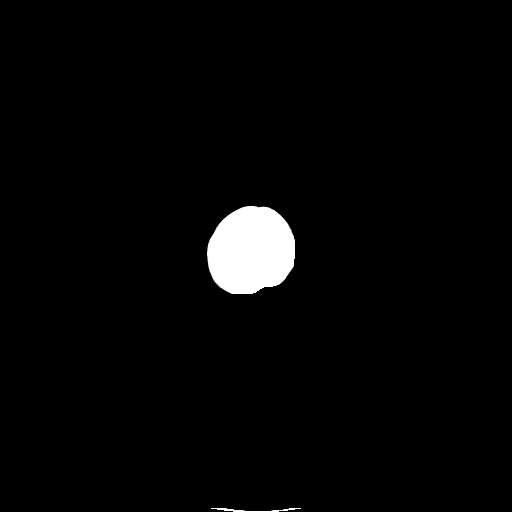

[16 of 30 positions shown; findings below may reference images not displayed]

FINDINGS: The brain appears normal without hemorrhage, infarct, mass lesion,
mass effect, midline shift or abnormal extra-axial fluid collection.
No hydrocephalus or pneumocephalus. The calvarium is intact. Imaged
paranasal sinuses and mastoid air cells are clear.
IMPRESSION: Normal head CT.

## 2017-01-23 DIAGNOSIS — J3089 Other allergic rhinitis: Secondary | ICD-10-CM | POA: Diagnosis not present

## 2017-01-23 DIAGNOSIS — J0110 Acute frontal sinusitis, unspecified: Secondary | ICD-10-CM | POA: Diagnosis not present

## 2017-03-12 DIAGNOSIS — J069 Acute upper respiratory infection, unspecified: Secondary | ICD-10-CM | POA: Diagnosis not present

## 2017-03-15 DIAGNOSIS — J069 Acute upper respiratory infection, unspecified: Secondary | ICD-10-CM | POA: Diagnosis not present

## 2017-03-25 DIAGNOSIS — J209 Acute bronchitis, unspecified: Secondary | ICD-10-CM | POA: Diagnosis not present

## 2017-07-17 DIAGNOSIS — J111 Influenza due to unidentified influenza virus with other respiratory manifestations: Secondary | ICD-10-CM | POA: Diagnosis not present

## 2017-07-23 DIAGNOSIS — J069 Acute upper respiratory infection, unspecified: Secondary | ICD-10-CM | POA: Diagnosis not present

## 2017-08-09 DIAGNOSIS — D1801 Hemangioma of skin and subcutaneous tissue: Secondary | ICD-10-CM | POA: Diagnosis not present

## 2017-08-09 DIAGNOSIS — Z85828 Personal history of other malignant neoplasm of skin: Secondary | ICD-10-CM | POA: Diagnosis not present

## 2017-08-09 DIAGNOSIS — L738 Other specified follicular disorders: Secondary | ICD-10-CM | POA: Diagnosis not present

## 2017-08-09 DIAGNOSIS — L814 Other melanin hyperpigmentation: Secondary | ICD-10-CM | POA: Diagnosis not present

## 2017-08-09 DIAGNOSIS — L570 Actinic keratosis: Secondary | ICD-10-CM | POA: Diagnosis not present

## 2017-08-13 DIAGNOSIS — Z0000 Encounter for general adult medical examination without abnormal findings: Secondary | ICD-10-CM | POA: Diagnosis not present

## 2017-08-13 DIAGNOSIS — Z23 Encounter for immunization: Secondary | ICD-10-CM | POA: Diagnosis not present

## 2017-08-13 DIAGNOSIS — R7303 Prediabetes: Secondary | ICD-10-CM | POA: Diagnosis not present

## 2017-08-13 DIAGNOSIS — Z136 Encounter for screening for cardiovascular disorders: Secondary | ICD-10-CM | POA: Diagnosis not present

## 2017-08-16 ENCOUNTER — Other Ambulatory Visit: Payer: Self-pay | Admitting: Family Medicine

## 2017-08-16 DIAGNOSIS — Z1231 Encounter for screening mammogram for malignant neoplasm of breast: Secondary | ICD-10-CM

## 2017-08-21 DIAGNOSIS — R350 Frequency of micturition: Secondary | ICD-10-CM | POA: Diagnosis not present

## 2017-09-15 ENCOUNTER — Ambulatory Visit
Admission: RE | Admit: 2017-09-15 | Discharge: 2017-09-15 | Disposition: A | Discharge status: Home / Self Care | Payer: BLUE CROSS/BLUE SHIELD | Source: Ambulatory Visit | Attending: Family Medicine | Admitting: Family Medicine

## 2017-09-15 DIAGNOSIS — Z1231 Encounter for screening mammogram for malignant neoplasm of breast: Secondary | ICD-10-CM | POA: Diagnosis not present

## 2017-09-22 ENCOUNTER — Encounter: Payer: Self-pay | Admitting: Family Medicine

## 2017-09-23 ENCOUNTER — Ambulatory Visit: Payer: BLUE CROSS/BLUE SHIELD | Admitting: Neurology

## 2017-10-04 DIAGNOSIS — M7711 Lateral epicondylitis, right elbow: Secondary | ICD-10-CM | POA: Diagnosis not present

## 2017-10-19 ENCOUNTER — Encounter: Payer: Self-pay | Admitting: Gastroenterology

## 2017-10-29 ENCOUNTER — Encounter: Payer: Self-pay | Admitting: Gastroenterology

## 2017-11-07 DIAGNOSIS — J029 Acute pharyngitis, unspecified: Secondary | ICD-10-CM | POA: Diagnosis not present

## 2017-11-07 DIAGNOSIS — Z20828 Contact with and (suspected) exposure to other viral communicable diseases: Secondary | ICD-10-CM | POA: Diagnosis not present

## 2017-11-07 DIAGNOSIS — J069 Acute upper respiratory infection, unspecified: Secondary | ICD-10-CM | POA: Diagnosis not present

## 2017-11-12 DIAGNOSIS — R0981 Nasal congestion: Secondary | ICD-10-CM | POA: Diagnosis not present

## 2017-11-22 ENCOUNTER — Ambulatory Visit: Payer: BLUE CROSS/BLUE SHIELD | Admitting: Neurology

## 2017-12-10 DIAGNOSIS — N762 Acute vulvitis: Secondary | ICD-10-CM | POA: Diagnosis not present

## 2017-12-13 ENCOUNTER — Ambulatory Visit (AMBULATORY_SURGERY_CENTER): Payer: Self-pay | Admitting: *Deleted

## 2017-12-13 ENCOUNTER — Other Ambulatory Visit: Payer: Self-pay

## 2017-12-13 VITALS — Ht 64.75 in | Wt 135.0 lb

## 2017-12-13 DIAGNOSIS — Z86010 Personal history of colonic polyps: Secondary | ICD-10-CM

## 2017-12-13 MED ORDER — NA SULFATE-K SULFATE-MG SULF 17.5-3.13-1.6 GM/177ML PO SOLN: 1.0000 | Freq: Once | ORAL | 0 refills | Status: AC

## 2017-12-13 NOTE — Progress Notes (Signed)
No egg or soy allergy known to patient  No issues with past sedation with any surgeries  or procedures, no intubation problems  No diet pills per patient No home 02 use per patient  No blood thinners per patient  Pt denies issues with constipation  No A fib or A flutter  EMMI video sent to pt's e mail -  $15 coupon for suprep to pt today in [place]

## 2017-12-17 ENCOUNTER — Encounter: Payer: Self-pay | Admitting: Gastroenterology

## 2017-12-27 ENCOUNTER — Other Ambulatory Visit: Payer: Self-pay

## 2017-12-27 ENCOUNTER — Encounter: Payer: Self-pay | Admitting: Gastroenterology

## 2017-12-27 ENCOUNTER — Ambulatory Visit (AMBULATORY_SURGERY_CENTER): Payer: BLUE CROSS/BLUE SHIELD | Admitting: Gastroenterology

## 2017-12-27 VITALS — BP 110/60 | HR 63 | Temp 98.0°F | Resp 13 | Ht 64.75 in | Wt 135.0 lb

## 2017-12-27 DIAGNOSIS — D125 Benign neoplasm of sigmoid colon: Secondary | ICD-10-CM

## 2017-12-27 DIAGNOSIS — Z1211 Encounter for screening for malignant neoplasm of colon: Secondary | ICD-10-CM | POA: Diagnosis not present

## 2017-12-27 DIAGNOSIS — Z800 Family history of malignant neoplasm of digestive organs: Secondary | ICD-10-CM | POA: Diagnosis not present

## 2017-12-27 DIAGNOSIS — K635 Polyp of colon: Secondary | ICD-10-CM

## 2017-12-27 DIAGNOSIS — Z86010 Personal history of colonic polyps: Secondary | ICD-10-CM

## 2017-12-27 MED ORDER — SODIUM CHLORIDE 0.9 % IV SOLN: 500.0000 mL | Freq: Once | INTRAVENOUS | Status: DC

## 2017-12-27 NOTE — Progress Notes (Signed)
To PACU, VSS. Report to [email or web address] 

## 2017-12-27 NOTE — Patient Instructions (Signed)
*  Handouts given to patient on diverticulosis, polyps and hemorrhoids.   YOU HAD AN ENDOSCOPIC PROCEDURE TODAY AT [facility]:   Refer to the procedure report that was given to you for any specific questions about what was found during the examination.  If the procedure report does not answer your questions, please call your gastroenterologist to clarify.  If you requested that your care partner not be given the details of your procedure findings, then the procedure report has been included in a sealed envelope for you to review at your convenience later.  YOU SHOULD EXPECT: Some feelings of bloating in the abdomen. Passage of more gas than usual.  Walking can help get rid of the air that was put into your GI tract during the procedure and reduce the bloating. If you had a lower endoscopy (such as a colonoscopy or flexible sigmoidoscopy) you may notice spotting of blood in your stool or on the toilet paper. If you underwent a bowel prep for your procedure, you may not have a normal bowel movement for a few days.  Please Note:  You might notice some irritation and congestion in your nose or some drainage.  This is from the oxygen used during your procedure.  There is no need for concern and it should clear up in a day or so.  SYMPTOMS TO REPORT IMMEDIATELY:   Following lower endoscopy (colonoscopy or flexible sigmoidoscopy):  Excessive amounts of blood in the stool  Significant tenderness or worsening of abdominal pains  Swelling of the abdomen that is new, acute  Fever of 100F or higher   For urgent or emergent issues, a gastroenterologist can be reached at any hour by calling [phone].   DIET:  We do recommend a small meal at first, but then you may proceed to your regular diet.  Drink plenty of fluids but you should avoid alcoholic beverages for 24 hours.  ACTIVITY:  You should plan to take it easy for the rest of today and you should NOT DRIVE or use heavy machinery  until tomorrow (because of the sedation medicines used during the test).    FOLLOW UP: Our staff will call the number listed on your records the next business day following your procedure to check on you and address any questions or concerns that you may have regarding the information given to you following your procedure. If we do not reach you, we will leave a message.  However, if you are feeling well and you are not experiencing any problems, there is no need to return our call.  We will assume that you have returned to your regular daily activities without incident.  If any biopsies were taken you will be contacted by phone or by letter within the next 1-3 weeks.  Please call us at [phone] if you have not heard about the biopsies in 3 weeks.    SIGNATURES/CONFIDENTIALITY: You and/or your care partner have signed paperwork which will be entered into your electronic medical record.  These signatures attest to the fact that that the information above on your After Visit Summary has been reviewed and is understood.  Full responsibility of the confidentiality of this discharge information lies with you and/or your care-partner.

## 2017-12-27 NOTE — Progress Notes (Signed)
Called to room to assist during endoscopic procedure.  Patient ID and intended procedure confirmed with present staff. Received instructions for my participation in the procedure from the performing physician.  

## 2017-12-27 NOTE — Op Note (Signed)
[place] Patient Name: Amber Singleton Procedure Date: 12/27/2017 8:37 AM MRN: [id number] Endoscopist: Ladene Artist , MD Age: 57 Referring MD:  Date of Birth: 1961/07/28 Gender: Female Account #: [id number] Procedure:                Colonoscopy Indications:              Surveillance: Personal history of adenomatous                            polyps on last colonoscopy 3 years ago. Family                            history of colon cancer. Medicines:                Monitored Anesthesia Care Procedure:                Pre-Anesthesia Assessment:                           - Prior to the procedure, a History and Physical                            was performed, and patient medications and                            allergies were reviewed. The patient's tolerance of                            previous anesthesia was also reviewed. The risks                            and benefits of the procedure and the sedation                            options and risks were discussed with the patient.                            All questions were answered, and informed consent                            was obtained. Prior Anticoagulants: The patient has                            taken no previous anticoagulant or antiplatelet                            agents. ASA Grade Assessment: II - A patient with                            mild systemic disease. After reviewing the risks                            and benefits, the patient was deemed in  satisfactory condition to undergo the procedure.                           After obtaining informed consent, the colonoscope                            was passed under direct vision. Throughout the                            procedure, the patient's blood pressure, pulse, and                            oxygen saturations were monitored continuously. The                            Colonoscope was introduced through the  anus and                            advanced to the the cecum, identified by                            appendiceal orifice and ileocecal valve. The                            ileocecal valve, appendiceal orifice, and rectum                            were photographed. The quality of the bowel                            preparation was excellent. The colonoscopy was                            performed without difficulty. The patient tolerated                            the procedure well. Scope In: 8:47:16 AM Scope Out: 9:01:42 AM Scope Withdrawal Time: 0 hours 10 minutes 9 seconds  Total Procedure Duration: 0 hours 14 minutes 26 seconds  Findings:                 A 7 mm polyp was found in the sigmoid colon. The                            polyp was sessile. The polyp was removed with a                            cold snare. Resection and retrieval were complete.                           External hemorrhoids were found on perianal exam.                           A few small-mouthed diverticula were found in the  sigmoid colon.                           Internal hemorrhoids were found during                            retroflexion. The hemorrhoids were medium-sized and                            Grade I (internal hemorrhoids that do not prolapse).                           The exam was otherwise without abnormality on                            direct and retroflexion views. Complications:            No immediate complications. Estimated blood loss:                            None. Estimated Blood Loss:     Estimated blood loss: none. Impression:               - Sigmiod colon polyp. Removed with a cold snare.                           - External hemorrhoids found on perianal exam.                           - Diverticulosis in the sigmoid colon.                           - Internal hemorrhoids.                           - The examination was otherwise normal on  direct                            and retroflexion views.                           - No specimens collected. Recommendation:           - Repeat colonoscopy in 5 years for surveillance.                           - Patient has a contact number available for                            emergencies. The signs and symptoms of potential                            delayed complications were discussed with the                            patient. Return to normal activities tomorrow.  Written discharge instructions were provided to the                            patient.                           - Resume previous diet.                           - Continue present medications.                           - Await pathology results. Ladene Artist, MD 12/27/2017 9:07:04 AM This report has been signed electronically.

## 2017-12-27 NOTE — Progress Notes (Signed)
Pt's states no medical or surgical changes since previsit or office visit. 

## 2017-12-28 ENCOUNTER — Telehealth: Payer: Self-pay

## 2017-12-28 ENCOUNTER — Telehealth: Payer: Self-pay | Admitting: *Deleted

## 2017-12-28 DIAGNOSIS — E7800 Pure hypercholesterolemia, unspecified: Secondary | ICD-10-CM | POA: Diagnosis not present

## 2017-12-28 NOTE — Telephone Encounter (Signed)
  Follow up Call-  Call back number 12/27/2017  Post procedure Call Back phone  # [phone]  Permission to leave phone message Yes  Some recent data might be hidden     No ID on answering machine.  No message left.  Will make second attempt this afternoon. Callee Rohrig/Call-back

## 2017-12-28 NOTE — Telephone Encounter (Signed)
  Follow up Call-  Call back number 12/27/2017  Post procedure Call Back phone  # [phone]  Permission to leave phone message Yes  Some recent data might be hidden     Patient questions:  Do you have a fever, pain , or abdominal swelling? No. Pain Score  0 *  Have you tolerated food without any problems? Yes.    Have you been able to return to your normal activities? Yes.    Do you have any questions about your discharge instructions: Diet   No. Medications  No. Follow up visit  No.  Do you have questions or concerns about your Care? No.  Actions: * If pain score is 4 or above: No action needed, pain <4.

## 2017-12-31 ENCOUNTER — Encounter: Payer: Self-pay | Admitting: Gastroenterology

## 2018-01-12 ENCOUNTER — Ambulatory Visit: Payer: BLUE CROSS/BLUE SHIELD | Admitting: Neurology

## 2018-01-12 ENCOUNTER — Other Ambulatory Visit: Payer: Self-pay

## 2018-01-12 ENCOUNTER — Encounter: Payer: Self-pay | Admitting: Neurology

## 2018-01-12 VITALS — BP 104/66 | HR 74 | Resp 16 | Ht 64.0 in | Wt 129.5 lb

## 2018-01-12 DIAGNOSIS — G35 Multiple sclerosis: Secondary | ICD-10-CM | POA: Diagnosis not present

## 2018-01-12 DIAGNOSIS — Z8669 Personal history of other diseases of the nervous system and sense organs: Secondary | ICD-10-CM

## 2018-01-12 DIAGNOSIS — R269 Unspecified abnormalities of gait and mobility: Secondary | ICD-10-CM

## 2018-01-12 NOTE — Progress Notes (Signed)
[facility]  PATIENT: Amber BARRELL DOB: [birth year]  REFERRING DOCTOR OR PCP:  Kelton Pillar SOURCE: patient, trecords form Dr. Laurann Montana  _________________________________   HISTORICAL  CHIEF COMPLAINT:  Chief Complaint  Patient presents with  . Multiple Sclerosis    Remains off of a dmt. Would like to discuss chronic dental problems and if there is any neuro cause for them.  Dtr. has been dx. with POTS/fim    HISTORY OF PRESENT ILLNESS:  Amber Singleton is a 57 y.o. woman with multiple sclerosis.   Update 01/12/2018: She was diagnosed with MS in 1989 after presenting with left optic neuritis.  She has been off of disease modifying therapies for many years.  I compared the 2017 MRI to the 2010 MRI and there are no changes so she appears to have a fairly benign multiple sclerosis.Marland Kitchen   She was only on Betaseron x 3 months in the 1990's.    Gait is fairly normal.  She does not have much difficulties with balance.  Strength is fine.  She will sometimes have some numbness in the right leg.  She notes some left eye visual changes that are mild. When she drives, especially  She has some urinary urgency and frequency, not severe enough to treat.   She has some fatigue.  She has pain in the left maxillary sinus region and inside the mouth.    Her oral surgeon wanted to know if there was any changes involving the salivary gland but I did not see any.   She has some stress.   She is concerned about her daughter who was diagnosed with POTS.   Besides she has temperature fluctations and fatigue.   She is concerned about possibility of MS.     MRI brain 04-22-61 personally reviewed today.   She was T2/FLAIR hyperintense foci in the periventricular white matter in a pattern consistent with multiple sclerosis chronic demyelinating plaques.  There were no acute findings.  When compared to the MRI dated 05/10/2009, there were no changes.  She has a mucous retention cyst but no  significant chronic sinusitis in the right maxillary sinus.    From 09/23/2016: MS:   She was diagnosed with MS in 1989 after presenting with left optic neuritis and having an MRI of the brain consistent with the disease.  She's been off disease modifying therapies for many years. At the last visit, I checked an MRI of the brain and compared it and MRI from 2010. I showed her the actual MRI images. Some white matter foci, predominantly periventricular, consistent with multiple sclerosis. None of the foci appeared to be acute. She has no atrophy. When compared to the 2010 MRI, there is no interval change. We have discussed that even though I generally prefer my patients with MS to be on a disease modifying therapy, since she has gone so long without any changes on MRI and without any new clinical symptoms, that we can continue her off of a disease modifying therapy and check an MRI of the brain every couple years to make sure that there is no subclinical progression.  Neck pain/back pain:   She reports neck > back pain.   Earlier in 2017, she had more LBP and right leg pain and MRI showed a right lateral L3L4 disc herniation with some impingement on the right L3.    Neck pain is axial without radiation in to and arm.   If more sever, she gets headaches.  MRI of  the cervical spine in 2010 did not show much DJD/DDD.      Gait/strength/sensation: Gait is fairly normal. She is able to climb a ladder and right a bike without difficulty. Rarely, she feels her balance is slightly off. She denies any weakness. She does note a little bit of numbness in the right leg but also notes that the distribution is similar to where she will get radicular pain. She has known degenerative changes in her lumbar spine.  Vision: She has mild blurry vision out of the left eye at times. She denies any eye pain or diplopia.  She has not noted any change in color vision.  Bladder: She gets occasional urgency. In the past she was  diagnosed with interstitial cystitis and occasionally gets burning bladder pain and bladder spasms. She has rare stress incontinence.    Fatigue/sleep: She does note some fatigue but she feels she generally feels similar to friend of the same age. She notes that she has had more insomnia over the past year as she is going through menopause and has hot flashes at night.  Mood/cognition: She denies any depression or anxiety. She feels there are no major cognitive problems though she does have some word finding difficulties with decreased focus at times.  MS history: She presented with left visual blurring and left eye pain in 1989. Her ophthalmologist told her that she might have multiple sclerosis and referred her for an MRI. The MRI showed changes consistent with MS and she was referred to Dr. Mervyn Skeeters at [facility]. He diagnosed multiple sclerosis. At that time, there was not any FDA approved medication. In 1993, she had a second episode of optic neuritis. A year or so later she had a [place] sign when she would bend her neck with a zinging sensation down into her abdomen. She received a course of IV steroids for those symptoms. She then was started on Betaseron but had difficulty tolerating the medication. She was only on it for about 3 months before stopping due to the side effects. She has not gone on any disease modifying therapy since the mid 90s. She has had several MRIs of the brain and cervical spine since then which were unchanged.   An MRI of the brain performed 09/11/2016 showed no new lesions compared to an MRI from 2010. Also of note, she has no atrophy. She does have 15-20 periventricular foci with Dawson's fingers.   She does not have any plaques in the brainstem or cerebellum. The MRI of the cervical spine from 2010 shows 2 foci, one around C3-C4 and one at C6-C7 in the posterior columns. None of the foci on the brain or spine appeared to be acute. The MRI of the thoracic spine from 2012 showed a  normal spinal cord.    REVIEW OF SYSTEMS: Constitutional: No fevers, chills, sweats, or change in appetite.   Insomnia Eyes: No visual changes, double vision, eye pain Ear, nose and throat: No hearing loss, ear pain, nasal congestion, sore throat Cardiovascular: No chest pain, palpitations Respiratory: No shortness of breath at rest or with exertion.   No wheezes GastrointestinaI: No nausea, vomiting, diarrhea, abdominal pain, fecal incontinence Genitourinary: No dysuria, urinary retention or frequency.  No nocturia. Musculoskeletal: Notes back pain Integumentary: No rash, pruritus, skin lesions Neurological: as above Psychiatric: No depression at this time.  No anxiety Endocrine: No palpitations, diaphoresis, change in appetite, change in weigh or increased thirst Hematologic/Lymphatic: No anemia, purpura, petechiae. Allergic/Immunologic: No itchy/runny eyes, nasal congestion, recent  allergic reactions, rashes  ALLERGIES: Allergies  Allergen Reactions  . Penicillins Anaphylaxis    Childhood reaction.  . Codeine Nausea Only  . Darvocet [Propoxyphene N-Acetaminophen] Nausea Only  . Sulfur     Leg pain    HOME MEDICATIONS:  Current Outpatient Medications:  .  cyclobenzaprine (FLEXERIL) 5 MG tablet, Take 1 tablet (5 mg total) by mouth at bedtime., Disp: 30 tablet, Rfl: 11 .  DiphenhydrAMINE HCl (BENADRYL ALLERGY PO), Take 1 tablet by mouth daily as needed. For allergies., Disp: , Rfl:  .  Magnesium Malate POWD, 833 mg by Does not apply route daily., Disp: , Rfl:  .  Multiple Vitamins-Minerals (MULTIVITAMIN WITH MINERALS) tablet, Take 1 tablet by mouth daily., Disp: , Rfl:  .  tacrolimus (PROTOPIC) 0.1 % ointment, Apply topically as needed. , Disp: , Rfl:  .  azelastine (ASTELIN) 0.1 % nasal spray, 2 SPRAYS IN EACH NOSTRIL *TWICE A DAY NASALLY, Disp: , Rfl: 6 .  fluticasone (FLONASE) 50 MCG/ACT nasal spray, , Disp: , Rfl:  .  meloxicam (MOBIC) 7.5 MG tablet, Take 1 tablet  (7.5 mg total) by mouth daily. (Patient not taking: Reported on 12/13/2017), Disp: 30 tablet, Rfl: 11  Current Facility-Administered Medications:  .  0.9 %  sodium chloride infusion, 500 mL, Intravenous, Once, Ladene Artist, MD  PAST MEDICAL HISTORY: Past Medical History:  Diagnosis Date  . Allergy   . Arthritis   . Bursitis of hip    Bil  . Environmental allergies   . Hyperlipidemia    tx with diet  . IBS (irritable bowel syndrome)   . IC (interstitial cystitis)   . MS (multiple sclerosis) ([place])   . Neuromuscular disorder ([place])    MS  . Post-operative nausea and vomiting     PAST SURGICAL HISTORY: Past Surgical History:  Procedure Laterality Date  . ABDOMINAL HYSTERECTOMY    . AUGMENTATION MAMMAPLASTY Bilateral   . BREAST SURGERY     breast augmentation  . CESAREAN SECTION     x 2  . CHONDROPLASTY Right 05/29/2015   Procedure: CHONDROPLASTY MEDIAL FEMORAL CONDYLE AND PATELLOFEMORAL JOINT;  Surgeon: Dorna Leitz, MD;  Location: [place];  Service: Orthopedics;  Laterality: Right;  . COLONOSCOPY    . colonscopy     x 2  . DILATION AND CURETTAGE OF UTERUS  12/2007  . KNEE ARTHROSCOPY WITH EXCISION PLICA Right 03/26/4579   Procedure: MEDIAL SHELF PLICA EXCISION;  Surgeon: Dorna Leitz, MD;  Location: [place];  Service: Orthopedics;  Laterality: Right;  . KNEE ARTHROSCOPY WITH MEDIAL MENISECTOMY Right 05/29/2015   Procedure: RIGHT KNEE ARTHROSCOPY WITH PARTIAL MEDIAL MENISECTOMY;  Surgeon: Dorna Leitz, MD;  Location: [place];  Service: Orthopedics;  Laterality: Right;  . LIPOSUCTION MULTIPLE BODY PARTS    . NOVASURE ABLATION  12/2007  . POLYPECTOMY    . RHINOPLASTY    . TUBAL LIGATION    . UPPER GASTROINTESTINAL ENDOSCOPY    . WISDOM TOOTH EXTRACTION      FAMILY HISTORY: Family History  Problem Relation Age of Onset  . Breast cancer Mother   . Colon polyps Mother   . Lymphoma Father   . Diabetes Brother   . Colon  cancer Maternal Grandmother   . Colon cancer Maternal Grandfather   . Stomach cancer Neg Hx   . Esophageal cancer Neg Hx   . Rectal cancer Neg Hx     SOCIAL HISTORY:  Social History   Socioeconomic History  .  Marital status: Married    Spouse name: Not on file  . Number of children: Not on file  . Years of education: Not on file  . Highest education level: Not on file  Occupational History  . Not on file  Social Needs  . Financial resource strain: Not on file  . Food insecurity:    Worry: Not on file    Inability: Not on file  . Transportation needs:    Medical: Not on file    Non-medical: Not on file  Tobacco Use  . Smoking status: Never Smoker  . Smokeless tobacco: Never Used  Substance and Sexual Activity  . Alcohol use: Yes    Alcohol/week: 1.8 - 2.4 oz    Types: 3 - 4 Glasses of wine per week    Comment: wine 3-4 times per week  . Drug use: No  . Sexual activity: Yes    Birth control/protection: Surgical  Lifestyle  . Physical activity:    Days per week: Not on file    Minutes per session: Not on file  . Stress: Not on file  Relationships  . Social connections:    Talks on phone: Not on file    Gets together: Not on file    Attends religious service: Not on file    Active member of club or organization: Not on file    Attends meetings of clubs or organizations: Not on file    Relationship status: Not on file  . Intimate partner violence:    Fear of current or ex partner: Not on file    Emotionally abused: Not on file    Physically abused: Not on file    Forced sexual activity: Not on file  Other Topics Concern  . Not on file  Social History Narrative  . Not on file     PHYSICAL EXAM  Vitals:   01/12/18 1302  BP: 104/66  Pulse: 74  Resp: 16  Weight: 129 lb 8 oz (58.7 kg)  Height: 5' 4" (1.626 m)    Body mass index is 22.23 kg/m.   General: The patient is well-developed and well-nourished and in no acute distress   Neurologic  Exam  Mental status: The patient is alert and oriented x 3 at the time of the examination. The patient has apparent normal recent and remote memory, with an apparently normal attention span and concentration ability.   Speech is normal.  Cranial nerves: Extraocular movements are full.  She has a 1+ left APD.  Color vision is mildly reduced on the left.  Facial strength and sensation is normal.  Trapezius strength is normal. . No dysarthria is noted.  The tongue is midline, and the patient has symmetric elevation of the soft palate. No obvious hearing deficits are noted.  Motor:  Muscle bulk is normal.   Tone is normal. Strength is  5 / 5 in all 4 extremities.   Sensory: Sensory testing is intact to pinprick, soft touch and vibration sensation in all 4 extremities.  Coordination: Cerebellar testing reveals good finger-nose-finger and heel-to-shin bilaterally.  Gait and station: Station is normal.   Gait is normal.  Tandem gait is slightly wide.  Romberg is negative.   Reflexes: Deep tendon reflexes are symmetric and normal bilaterally in arms but increase in the legs.  There is spread of the knees.    DIAGNOSTIC DATA (LABS, IMAGING, TESTING) - I reviewed patient records, labs, notes, testing and imaging myself where available.  Lab Results  Component Value Date   WBC 12.5 (H) 10/24/2011   HGB 12.8 10/24/2011   HCT 38.0 10/24/2011   MCV 92.2 10/24/2011   PLT 213 10/24/2011      Component Value Date/Time   NA 137 12/20/2007 0732   K 3.6 12/20/2007 0732   CL 104 12/20/2007 0732   CO2 26 12/20/2007 0732   GLUCOSE 123 (H) 12/20/2007 0732   BUN 7 12/20/2007 0732   CREATININE 0.78 12/20/2007 0732   CALCIUM 9.2 12/20/2007 0732   GFRNONAA >60 12/20/2007 0732   GFRAA  12/20/2007 0732    >60        The eGFR has been calculated using the MDRD equation. This calculation has not been validated in all clinical       ASSESSMENT AND PLAN  Multiple sclerosis ([place])  Gait  disturbance  History of optic neuritis     1.    She will continue off of a disease modifying therapy.  MRI in 2017 did not show any new lesions compared to the MRI 2010.  We will check another MRI sometime next year.    We discussed restarting the medication if she does have any new MS lesions at that time.  Additionally, she has a relapse she will be consider treatment. 2.   Stay active and exercises as tolerated. 3.   Return in 1 year or sooner if there are new or worsening neurologic symptoms.  Brae Schaafsma A. Felecia Shelling, MD, PhD 0/05/4708, 6:28 PM Certified in Neurology, Clinical Neurophysiology, Sleep Medicine, Pain Medicine and Neuroimaging  [facility] [address], [place], [place] [address] [phone]

## 2018-02-08 DIAGNOSIS — D220 Melanocytic nevi of lip: Secondary | ICD-10-CM | POA: Diagnosis not present

## 2018-02-08 DIAGNOSIS — L603 Nail dystrophy: Secondary | ICD-10-CM | POA: Diagnosis not present

## 2018-02-08 DIAGNOSIS — Z85828 Personal history of other malignant neoplasm of skin: Secondary | ICD-10-CM | POA: Diagnosis not present

## 2018-06-10 DIAGNOSIS — N898 Other specified noninflammatory disorders of vagina: Secondary | ICD-10-CM | POA: Diagnosis not present

## 2018-06-10 DIAGNOSIS — R350 Frequency of micturition: Secondary | ICD-10-CM | POA: Diagnosis not present

## 2018-06-27 DIAGNOSIS — Z85828 Personal history of other malignant neoplasm of skin: Secondary | ICD-10-CM | POA: Diagnosis not present

## 2018-06-27 DIAGNOSIS — L245 Irritant contact dermatitis due to other chemical products: Secondary | ICD-10-CM | POA: Diagnosis not present

## 2018-08-01 DIAGNOSIS — Z23 Encounter for immunization: Secondary | ICD-10-CM | POA: Diagnosis not present

## 2018-09-29 DIAGNOSIS — J069 Acute upper respiratory infection, unspecified: Secondary | ICD-10-CM | POA: Diagnosis not present

## 2018-10-18 DIAGNOSIS — R6884 Jaw pain: Secondary | ICD-10-CM | POA: Diagnosis not present

## 2018-10-18 DIAGNOSIS — M542 Cervicalgia: Secondary | ICD-10-CM | POA: Diagnosis not present

## 2018-10-18 DIAGNOSIS — R51 Headache: Secondary | ICD-10-CM | POA: Diagnosis not present

## 2018-10-19 DIAGNOSIS — K219 Gastro-esophageal reflux disease without esophagitis: Secondary | ICD-10-CM | POA: Diagnosis not present

## 2018-10-19 DIAGNOSIS — Z0000 Encounter for general adult medical examination without abnormal findings: Secondary | ICD-10-CM | POA: Diagnosis not present

## 2018-10-19 DIAGNOSIS — G35 Multiple sclerosis: Secondary | ICD-10-CM | POA: Diagnosis not present

## 2018-10-19 DIAGNOSIS — E7800 Pure hypercholesterolemia, unspecified: Secondary | ICD-10-CM | POA: Diagnosis not present

## 2018-10-19 DIAGNOSIS — R7303 Prediabetes: Secondary | ICD-10-CM | POA: Diagnosis not present

## 2018-10-24 DIAGNOSIS — R6884 Jaw pain: Secondary | ICD-10-CM | POA: Diagnosis not present

## 2018-10-24 DIAGNOSIS — M542 Cervicalgia: Secondary | ICD-10-CM | POA: Diagnosis not present

## 2018-10-24 DIAGNOSIS — R51 Headache: Secondary | ICD-10-CM | POA: Diagnosis not present

## 2018-10-31 DIAGNOSIS — R6884 Jaw pain: Secondary | ICD-10-CM | POA: Diagnosis not present

## 2018-10-31 DIAGNOSIS — R51 Headache: Secondary | ICD-10-CM | POA: Diagnosis not present

## 2018-10-31 DIAGNOSIS — M542 Cervicalgia: Secondary | ICD-10-CM | POA: Diagnosis not present

## 2018-11-15 DIAGNOSIS — R51 Headache: Secondary | ICD-10-CM | POA: Diagnosis not present

## 2018-11-15 DIAGNOSIS — M542 Cervicalgia: Secondary | ICD-10-CM | POA: Diagnosis not present

## 2018-11-15 DIAGNOSIS — R6884 Jaw pain: Secondary | ICD-10-CM | POA: Diagnosis not present

## 2018-11-22 DIAGNOSIS — L309 Dermatitis, unspecified: Secondary | ICD-10-CM | POA: Diagnosis not present

## 2018-11-22 DIAGNOSIS — D220 Melanocytic nevi of lip: Secondary | ICD-10-CM | POA: Diagnosis not present

## 2018-11-22 DIAGNOSIS — I788 Other diseases of capillaries: Secondary | ICD-10-CM | POA: Diagnosis not present

## 2018-11-22 DIAGNOSIS — Z85828 Personal history of other malignant neoplasm of skin: Secondary | ICD-10-CM | POA: Diagnosis not present

## 2018-11-23 DIAGNOSIS — R51 Headache: Secondary | ICD-10-CM | POA: Diagnosis not present

## 2018-11-23 DIAGNOSIS — R6884 Jaw pain: Secondary | ICD-10-CM | POA: Diagnosis not present

## 2018-11-23 DIAGNOSIS — M542 Cervicalgia: Secondary | ICD-10-CM | POA: Diagnosis not present

## 2019-01-11 ENCOUNTER — Telehealth: Payer: Self-pay | Admitting: Neurology

## 2019-01-11 NOTE — Telephone Encounter (Signed)
Since she is not on any disease modifying therapy for the MS it would be okay to push back her appointment by couple months.

## 2019-01-11 NOTE — Telephone Encounter (Signed)
Dr. Felecia Shelling- are you ok with this? This would be her yearly f/u with you. She is not on DMT

## 2019-01-11 NOTE — Telephone Encounter (Signed)
Called, LVM for pt letting her know Dr. Felecia Shelling and I got message she r/s her appt for later sate. Dr. Felecia Shelling okay with this and advised her to call if she has any further questions/concerns.

## 2019-01-11 NOTE — Telephone Encounter (Signed)
Pt called in concerning her appt for 01/16/2019 she states she was offered a telephone visit and she would rather r/s to a later day due to not having any issues, pt r/s for 03/29/2019 @ 1pm

## 2019-01-16 ENCOUNTER — Ambulatory Visit: Payer: BLUE CROSS/BLUE SHIELD | Admitting: Neurology

## 2019-02-28 DIAGNOSIS — D485 Neoplasm of uncertain behavior of skin: Secondary | ICD-10-CM | POA: Diagnosis not present

## 2019-02-28 DIAGNOSIS — D2372 Other benign neoplasm of skin of left lower limb, including hip: Secondary | ICD-10-CM | POA: Diagnosis not present

## 2019-02-28 DIAGNOSIS — L570 Actinic keratosis: Secondary | ICD-10-CM | POA: Diagnosis not present

## 2019-03-29 ENCOUNTER — Ambulatory Visit: Payer: BLUE CROSS/BLUE SHIELD | Admitting: Neurology

## 2019-06-06 DIAGNOSIS — L814 Other melanin hyperpigmentation: Secondary | ICD-10-CM | POA: Diagnosis not present

## 2019-06-06 DIAGNOSIS — D225 Melanocytic nevi of trunk: Secondary | ICD-10-CM | POA: Diagnosis not present

## 2019-06-06 DIAGNOSIS — Z85828 Personal history of other malignant neoplasm of skin: Secondary | ICD-10-CM | POA: Diagnosis not present

## 2019-06-06 DIAGNOSIS — L738 Other specified follicular disorders: Secondary | ICD-10-CM | POA: Diagnosis not present

## 2019-06-07 ENCOUNTER — Other Ambulatory Visit: Payer: Self-pay | Admitting: Family Medicine

## 2019-06-07 DIAGNOSIS — Z1231 Encounter for screening mammogram for malignant neoplasm of breast: Secondary | ICD-10-CM

## 2019-07-21 ENCOUNTER — Ambulatory Visit: Payer: BLUE CROSS/BLUE SHIELD

## 2019-09-04 DIAGNOSIS — M545 Low back pain: Secondary | ICD-10-CM | POA: Diagnosis not present

## 2019-09-12 ENCOUNTER — Other Ambulatory Visit: Payer: Self-pay

## 2019-09-12 ENCOUNTER — Ambulatory Visit
Admission: RE | Admit: 2019-09-12 | Discharge: 2019-09-12 | Disposition: A | Discharge status: Home / Self Care | Payer: Federal, State, Local not specified - PPO | Source: Ambulatory Visit | Attending: Family Medicine | Admitting: Family Medicine

## 2019-09-12 DIAGNOSIS — Z1231 Encounter for screening mammogram for malignant neoplasm of breast: Secondary | ICD-10-CM | POA: Diagnosis not present

## 2019-09-13 DIAGNOSIS — R350 Frequency of micturition: Secondary | ICD-10-CM | POA: Diagnosis not present

## 2019-10-06 ENCOUNTER — Other Ambulatory Visit: Payer: BC Managed Care – PPO

## 2019-10-06 DIAGNOSIS — Z20828 Contact with and (suspected) exposure to other viral communicable diseases: Secondary | ICD-10-CM | POA: Diagnosis not present

## 2019-10-06 DIAGNOSIS — Z03818 Encounter for observation for suspected exposure to other biological agents ruled out: Secondary | ICD-10-CM | POA: Diagnosis not present

## 2019-11-10 DIAGNOSIS — M722 Plantar fascial fibromatosis: Secondary | ICD-10-CM | POA: Diagnosis not present

## 2019-11-10 DIAGNOSIS — M25562 Pain in left knee: Secondary | ICD-10-CM | POA: Diagnosis not present

## 2019-11-10 DIAGNOSIS — M25561 Pain in right knee: Secondary | ICD-10-CM | POA: Diagnosis not present

## 2019-11-15 DIAGNOSIS — R7303 Prediabetes: Secondary | ICD-10-CM | POA: Diagnosis not present

## 2019-11-15 DIAGNOSIS — Z0000 Encounter for general adult medical examination without abnormal findings: Secondary | ICD-10-CM | POA: Diagnosis not present

## 2019-11-15 DIAGNOSIS — E7800 Pure hypercholesterolemia, unspecified: Secondary | ICD-10-CM | POA: Diagnosis not present

## 2019-11-27 DIAGNOSIS — R310 Gross hematuria: Secondary | ICD-10-CM | POA: Diagnosis not present

## 2019-12-06 DIAGNOSIS — R310 Gross hematuria: Secondary | ICD-10-CM | POA: Diagnosis not present

## 2019-12-06 DIAGNOSIS — N289 Disorder of kidney and ureter, unspecified: Secondary | ICD-10-CM | POA: Diagnosis not present

## 2019-12-06 DIAGNOSIS — N281 Cyst of kidney, acquired: Secondary | ICD-10-CM | POA: Diagnosis not present

## 2019-12-21 DIAGNOSIS — R3915 Urgency of urination: Secondary | ICD-10-CM | POA: Diagnosis not present

## 2019-12-21 DIAGNOSIS — N3090 Cystitis, unspecified without hematuria: Secondary | ICD-10-CM | POA: Diagnosis not present

## 2019-12-22 ENCOUNTER — Ambulatory Visit: Payer: BC Managed Care – PPO

## 2020-01-02 DIAGNOSIS — N952 Postmenopausal atrophic vaginitis: Secondary | ICD-10-CM | POA: Diagnosis not present

## 2020-01-02 DIAGNOSIS — R310 Gross hematuria: Secondary | ICD-10-CM | POA: Diagnosis not present

## 2020-01-05 ENCOUNTER — Ambulatory Visit: Payer: BC Managed Care – PPO | Attending: Internal Medicine

## 2020-01-05 DIAGNOSIS — Z23 Encounter for immunization: Secondary | ICD-10-CM

## 2020-01-05 NOTE — Progress Notes (Signed)
   Covid-19 Vaccination Clinic  Name:  Amber Singleton    MRN: [id number] DOB: 05-01-1961  01/05/2020  [name] was observed post Covid-19 immunization for 15 minutes without incident. She was provided with Vaccine Information Sheet and instruction to access the V-Safe system.   [name] was instructed to call 911 with any severe reactions post vaccine: Marland Kitchen Difficulty breathing  . Swelling of face and throat  . A fast heartbeat  . A bad rash all over body  . Dizziness and weakness   Immunizations Administered    Name Date Dose VIS Date Route   Pfizer COVID-19 Vaccine 01/05/2020  2:58 PM 0.3 mL 11/08/2018 Intramuscular   Manufacturer: [place]   Lot: [id number]   [place]: [id number]

## 2020-01-10 DIAGNOSIS — I788 Other diseases of capillaries: Secondary | ICD-10-CM | POA: Diagnosis not present

## 2020-01-10 DIAGNOSIS — Z85828 Personal history of other malignant neoplasm of skin: Secondary | ICD-10-CM | POA: Diagnosis not present

## 2020-01-10 DIAGNOSIS — L738 Other specified follicular disorders: Secondary | ICD-10-CM | POA: Diagnosis not present

## 2020-01-29 ENCOUNTER — Ambulatory Visit: Payer: BC Managed Care – PPO | Attending: Internal Medicine

## 2020-01-29 DIAGNOSIS — Z23 Encounter for immunization: Secondary | ICD-10-CM

## 2020-01-29 NOTE — Progress Notes (Signed)
   Covid-19 Vaccination Clinic  Name:  Amber Singleton    MRN: [id number] DOB: 16-Nov-1960  01/29/2020  [name] was observed post Covid-19 immunization for 30 minutes based on pre-vaccination screening without incident. She was provided with Vaccine Information Sheet and instruction to access the V-Safe system.   [name] was instructed to call 911 with any severe reactions post vaccine: Marland Kitchen Difficulty breathing  . Swelling of face and throat  . A fast heartbeat  . A bad rash all over body  . Dizziness and weakness   Immunizations Administered    Name Date Dose VIS Date Route   Pfizer COVID-19 Vaccine 01/29/2020  3:21 PM 0.3 mL 11/08/2018 Intramuscular   Manufacturer: [place]   Lot: [id number]   [place]: [id number]

## 2020-02-05 DIAGNOSIS — Z20828 Contact with and (suspected) exposure to other viral communicable diseases: Secondary | ICD-10-CM | POA: Diagnosis not present

## 2020-02-05 DIAGNOSIS — Z03818 Encounter for observation for suspected exposure to other biological agents ruled out: Secondary | ICD-10-CM | POA: Diagnosis not present

## 2020-04-03 DIAGNOSIS — R310 Gross hematuria: Secondary | ICD-10-CM | POA: Diagnosis not present

## 2020-04-03 DIAGNOSIS — N952 Postmenopausal atrophic vaginitis: Secondary | ICD-10-CM | POA: Diagnosis not present

## 2020-05-28 DIAGNOSIS — Z03818 Encounter for observation for suspected exposure to other biological agents ruled out: Secondary | ICD-10-CM | POA: Diagnosis not present

## 2020-05-28 DIAGNOSIS — J00 Acute nasopharyngitis [common cold]: Secondary | ICD-10-CM | POA: Diagnosis not present

## 2020-07-16 DIAGNOSIS — Z85828 Personal history of other malignant neoplasm of skin: Secondary | ICD-10-CM | POA: Diagnosis not present

## 2020-07-16 DIAGNOSIS — L570 Actinic keratosis: Secondary | ICD-10-CM | POA: Diagnosis not present

## 2020-07-16 DIAGNOSIS — D225 Melanocytic nevi of trunk: Secondary | ICD-10-CM | POA: Diagnosis not present

## 2020-07-16 DIAGNOSIS — D2262 Melanocytic nevi of left upper limb, including shoulder: Secondary | ICD-10-CM | POA: Diagnosis not present

## 2020-07-16 DIAGNOSIS — D2271 Melanocytic nevi of right lower limb, including hip: Secondary | ICD-10-CM | POA: Diagnosis not present

## 2020-08-19 DIAGNOSIS — J069 Acute upper respiratory infection, unspecified: Secondary | ICD-10-CM | POA: Diagnosis not present

## 2020-08-21 DIAGNOSIS — J019 Acute sinusitis, unspecified: Secondary | ICD-10-CM | POA: Diagnosis not present

## 2020-08-21 DIAGNOSIS — Z03818 Encounter for observation for suspected exposure to other biological agents ruled out: Secondary | ICD-10-CM | POA: Diagnosis not present

## 2020-10-02 DIAGNOSIS — K219 Gastro-esophageal reflux disease without esophagitis: Secondary | ICD-10-CM | POA: Diagnosis not present

## 2020-10-16 DIAGNOSIS — L718 Other rosacea: Secondary | ICD-10-CM | POA: Diagnosis not present

## 2020-10-16 DIAGNOSIS — Z85828 Personal history of other malignant neoplasm of skin: Secondary | ICD-10-CM | POA: Diagnosis not present

## 2020-10-21 ENCOUNTER — Other Ambulatory Visit: Payer: Self-pay | Admitting: Family Medicine

## 2020-10-21 DIAGNOSIS — Z1231 Encounter for screening mammogram for malignant neoplasm of breast: Secondary | ICD-10-CM

## 2020-11-05 ENCOUNTER — Encounter: Payer: Self-pay | Admitting: Gastroenterology

## 2020-11-20 ENCOUNTER — Encounter: Payer: Self-pay | Admitting: Gastroenterology

## 2020-11-20 ENCOUNTER — Ambulatory Visit: Payer: BC Managed Care – PPO | Admitting: Gastroenterology

## 2020-11-20 VITALS — BP 120/78 | HR 80 | Wt 136.0 lb

## 2020-11-20 DIAGNOSIS — R1011 Right upper quadrant pain: Secondary | ICD-10-CM | POA: Diagnosis not present

## 2020-11-20 DIAGNOSIS — Z0000 Encounter for general adult medical examination without abnormal findings: Secondary | ICD-10-CM | POA: Diagnosis not present

## 2020-11-20 DIAGNOSIS — G35 Multiple sclerosis: Secondary | ICD-10-CM | POA: Diagnosis not present

## 2020-11-20 DIAGNOSIS — R1013 Epigastric pain: Secondary | ICD-10-CM

## 2020-11-20 DIAGNOSIS — E7800 Pure hypercholesterolemia, unspecified: Secondary | ICD-10-CM | POA: Diagnosis not present

## 2020-11-20 DIAGNOSIS — R7303 Prediabetes: Secondary | ICD-10-CM | POA: Diagnosis not present

## 2020-11-20 DIAGNOSIS — K219 Gastro-esophageal reflux disease without esophagitis: Secondary | ICD-10-CM | POA: Diagnosis not present

## 2020-11-20 MED ORDER — SUCRALFATE 1 GM/10ML PO SUSP
1.0000 g | Freq: Four times a day (QID) | ORAL | 1 refills | Status: DC
Start: 2020-11-20 — End: 2023-03-09

## 2020-11-20 NOTE — Patient Instructions (Addendum)
If you are age 60 or older, your body mass index should be between 23-30. Your Body mass index is 23.34 kg/m. If this is out of the aforementioned range listed, please consider follow up with your Primary Care Provider.  If you are age 30 or younger, your body mass index should be between 19-25. Your Body mass index is 23.34 kg/m. If this is out of the aformentioned range listed, please consider follow up with your Primary Care Provider.   We have sent the following medications to your pharmacy for you to pick up at your convenience: Carafate suspension 10 ml daily before meals and at bedtime.  You have been scheduled for an endoscopy. Please follow written instructions given to you at your visit today. If you use inhalers (even only as needed), please bring them with you on the day of your procedure.  Due to recent changes in healthcare laws, you may see the results of your imaging and laboratory studies on MyChart before your provider has had a chance to review them.  We understand that in some cases there may be results that are confusing or concerning to you. Not all laboratory results come back in the same time frame and the provider may be waiting for multiple results in order to interpret others.  Please give [place] 48 hours in order for your provider to thoroughly review all the results before contacting the office for clarification of your results.

## 2020-11-20 NOTE — Progress Notes (Signed)
Reviewed and agree with management plan.  Ali Mclaurin T. Philmore Lepore, MD FACG [phone]  

## 2020-11-20 NOTE — Progress Notes (Signed)
11/20/2020 Amber Singleton [id number] 03/23/1961   HISTORY OF PRESENT ILLNESS: This is a 60 year old female who is known to Dr. Fuller Plan only for colonoscopy.  She presents here today with complaints of heartburn/reflux along with some upper abdominal pain, particularly on the right side.  She said that a couple of months ago she started with a lot of heartburn.  Initially she tried some Pepcid but things just seem to get worse.  H. pylori serology was negative.  She had a CT scan of the abdomen and pelvis with contrast a year ago at urology that showed only possible tiny layering gallstones from a GI standpoint.  She says that drinking wine definitely seems to make her symptoms worse.  She has been on pantoprazole 40 mg daily for the past few weeks and has not really noticed any improvement.   Past Medical History:  Diagnosis Date  . Allergy   . Arthritis   . Bursitis of hip    Bil  . Environmental allergies   . Hyperlipidemia    tx with diet  . IBS (irritable bowel syndrome)   . IC (interstitial cystitis)   . MS (multiple sclerosis) ([place])   . Neuromuscular disorder ([place])    MS  . Post-operative nausea and vomiting    Past Surgical History:  Procedure Laterality Date  . ABDOMINAL HYSTERECTOMY    . AUGMENTATION MAMMAPLASTY Bilateral   . BREAST SURGERY     breast augmentation  . CESAREAN SECTION     x 2  . CHONDROPLASTY Right 05/29/2015   Procedure: CHONDROPLASTY MEDIAL FEMORAL CONDYLE AND PATELLOFEMORAL JOINT;  Surgeon: Dorna Leitz, MD;  Location: [place];  Service: Orthopedics;  Laterality: Right;  . COLONOSCOPY    . colonscopy     x 2  . DILATION AND CURETTAGE OF UTERUS  12/2007  . KNEE ARTHROSCOPY WITH EXCISION PLICA Right 3/76/2831   Procedure: MEDIAL SHELF PLICA EXCISION;  Surgeon: Dorna Leitz, MD;  Location: [place];  Service: Orthopedics;  Laterality: Right;  . KNEE ARTHROSCOPY WITH MEDIAL MENISECTOMY Right 05/29/2015   Procedure:  RIGHT KNEE ARTHROSCOPY WITH PARTIAL MEDIAL MENISECTOMY;  Surgeon: Dorna Leitz, MD;  Location: [place];  Service: Orthopedics;  Laterality: Right;  . LIPOSUCTION MULTIPLE BODY PARTS    . NOVASURE ABLATION  12/2007  . POLYPECTOMY    . RHINOPLASTY    . TUBAL LIGATION    . UPPER GASTROINTESTINAL ENDOSCOPY    . WISDOM TOOTH EXTRACTION      reports that she has never smoked. She has never used smokeless tobacco. She reports current alcohol use of about 3.0 - 4.0 standard drinks of alcohol per week. She reports that she does not use drugs. family history includes Breast cancer in her mother; Colon cancer in her maternal grandfather and maternal grandmother; Colon polyps in her mother; Diabetes in her brother; Lymphoma in her father. Allergies  Allergen Reactions  . Penicillins Anaphylaxis    Childhood reaction.  . Ciprofloxacin     Leg Pain  . Codeine Nausea Only  . Darvocet [Propoxyphene N-Acetaminophen] Nausea Only  . Elemental Sulfur     Leg pain  . Septra [Sulfamethoxazole-Trimethoprim] Nausea Only      Outpatient Encounter Medications as of 11/20/2020  Medication Sig  . Cholecalciferol (VITAMIN D) 50 MCG (2000 UT) CAPS Take 1 capsule by mouth daily.  . DiphenhydrAMINE HCl (BENADRYL ALLERGY PO) Take 1 tablet by mouth daily as needed. For allergies.  Marland Kitchen  estradiol (ESTRACE) 0.1 MG/GM vaginal cream as directed.  . Ibuprofen (ADVIL) 200 MG CAPS Take 1 capsule by mouth as directed.  . Multiple Vitamins-Minerals (MULTIVITAMIN WITH MINERALS) tablet Take 1 tablet by mouth daily.  . pantoprazole (PROTONIX) 40 MG tablet Take 40 mg by mouth daily.  . Magnesium Malate POWD 833 mg by Does not apply route daily. (Patient not taking: Reported on 11/20/2020)  . [DISCONTINUED] azelastine (ASTELIN) 0.1 % nasal spray 2 SPRAYS IN EACH NOSTRIL *TWICE A DAY NASALLY  . [DISCONTINUED] cyclobenzaprine (FLEXERIL) 5 MG tablet Take 1 tablet (5 mg total) by mouth at bedtime.  . [DISCONTINUED]  fluticasone (FLONASE) 50 MCG/ACT nasal spray   . [DISCONTINUED] meloxicam (MOBIC) 7.5 MG tablet Take 1 tablet (7.5 mg total) by mouth daily. (Patient not taking: Reported on 12/13/2017)  . [DISCONTINUED] tacrolimus (PROTOPIC) 0.1 % ointment Apply topically as needed.    Facility-Administered Encounter Medications as of 11/20/2020  Medication  . 0.9 %  sodium chloride infusion     REVIEW OF SYSTEMS  : All other systems reviewed and negative except where noted in the History of Present Illness.   PHYSICAL EXAM: BP 120/78   Pulse 80   Wt 136 lb (61.7 kg)   LMP 09/27/2011   BMI 23.34 kg/m  General: Well developed white female in no acute distress Head: Normocephalic and atraumatic Eyes:  Sclerae anicteric, conjunctiva pink. Ears: Normal auditory acuity Lungs: Clear throughout to auscultation; no W/R/R. Heart: Regular rate and rhythm; no M/R/G. Abdomen: Soft, non-distended.  BS present.  Mild epigastric TTP. Musculoskeletal: Symmetrical with no gross deformities  Skin: No lesions on visible extremities Extremities: No edema  Neurological: Alert oriented x 4, grossly non-focal Psychological:  Alert and cooperative. Normal mood and affect  ASSESSMENT AND PLAN: *GERD, epigastric abdominal pain, right upper quadrant abdominal pain: CT scan showed tiny gallstones.  Is on pantoprazole 40 mg daily for the past few weeks with minimal improvement.  Had tried Pepcid previously as well.  H. pylori serology negative.  We will plan for EGD with Dr. Fuller Plan to rule out esophagitis, ulcer disease, etc.  The risks, benefits, and alternatives to EGD were discussed with the patient and she consents to proceed.  She will continue pantoprazole 40 mg daily and I will add Carafate suspension before meals and at bedtime in the interim.  Prescription sent to pharmacy.   CC:  Kelton Pillar, MD

## 2020-11-22 ENCOUNTER — Other Ambulatory Visit: Payer: Self-pay

## 2020-11-22 ENCOUNTER — Other Ambulatory Visit: Payer: Self-pay | Admitting: Gastroenterology

## 2020-11-22 ENCOUNTER — Ambulatory Visit (AMBULATORY_SURGERY_CENTER): Payer: BC Managed Care – PPO | Admitting: Gastroenterology

## 2020-11-22 ENCOUNTER — Encounter: Payer: Self-pay | Admitting: Gastroenterology

## 2020-11-22 VITALS — BP 110/70 | HR 64 | Temp 98.2°F | Resp 14 | Wt 136.0 lb

## 2020-11-22 DIAGNOSIS — K219 Gastro-esophageal reflux disease without esophagitis: Secondary | ICD-10-CM

## 2020-11-22 DIAGNOSIS — K3189 Other diseases of stomach and duodenum: Secondary | ICD-10-CM | POA: Diagnosis not present

## 2020-11-22 DIAGNOSIS — R1011 Right upper quadrant pain: Secondary | ICD-10-CM

## 2020-11-22 DIAGNOSIS — K317 Polyp of stomach and duodenum: Secondary | ICD-10-CM

## 2020-11-22 MED ORDER — SODIUM CHLORIDE 0.9 % IV SOLN: 500.0000 mL | Freq: Once | INTRAVENOUS | Status: DC

## 2020-11-22 NOTE — Op Note (Signed)
[place] Patient Name: Amber Singleton Procedure Date: 11/22/2020 10:36 AM MRN: [id number] Endoscopist: Ladene Artist , MD Age: 60 Referring MD:  Date of Birth: December 09, 1960 Gender: Female Account #: [id number] Procedure:                Upper GI endoscopy Indications:              Abdominal pain in the right upper quadrant,                            Suspected gastroesophageal reflux disease Medicines:                Monitored Anesthesia Care Procedure:                Pre-Anesthesia Assessment:                           - Prior to the procedure, a History and Physical                            was performed, and patient medications and                            allergies were reviewed. The patient's tolerance of                            previous anesthesia was also reviewed. The risks                            and benefits of the procedure and the sedation                            options and risks were discussed with the patient.                            All questions were answered, and informed consent                            was obtained. Prior Anticoagulants: The patient has                            taken no previous anticoagulant or antiplatelet                            agents. ASA Grade Assessment: II - A patient with                            mild systemic disease. After reviewing the risks                            and benefits, the patient was deemed in                            satisfactory condition to undergo the procedure.  After obtaining informed consent, the endoscope was                            passed under direct vision. Throughout the                            procedure, the patient's blood pressure, pulse, and                            oxygen saturations were monitored continuously. The                            Endoscope was introduced through the mouth, and                            advanced to the  second part of duodenum. The upper                            GI endoscopy was accomplished without difficulty.                            The patient tolerated the procedure well. Scope In: Scope Out: Findings:                 The examined esophagus was normal.                           A few 3 to 5 mm sessile polyps with no bleeding and                            no stigmata of recent bleeding were found in the                            gastric fundus. Biopsies were taken with a cold                            forceps for histology.                           Diffuse mildly erythematous mucosa without bleeding                            was found in the entire examined stomach. Biopsies                            were taken with a cold forceps for histology.                           The exam of the stomach was otherwise normal.                           The duodenal bulb and second portion of the  duodenum were normal. Complications:            No immediate complications. Estimated Blood Loss:     Estimated blood loss was minimal. Impression:               - Normal esophagus.                           - A few gastric polyps. Biopsied.                           - Erythematous mucosa in the stomach. Biopsied.                           - Normal duodenal bulb and second portion of the                            duodenum. Recommendation:           - Patient has a contact number available for                            emergencies. The signs and symptoms of potential                            delayed complications were discussed with the                            patient. Return to normal activities tomorrow.                            Written discharge instructions were provided to the                            patient.                           - Resume previous diet.                           - Follow antireflux measures.                           -  Continue present medications.                           - Await pathology results. Ladene Artist, MD 11/22/2020 11:01:05 AM This report has been signed electronically.

## 2020-11-22 NOTE — Patient Instructions (Signed)
Await pathology results.  Resume previous diet and medications.  YOU HAD AN ENDOSCOPIC PROCEDURE TODAY AT [facility]:   Refer to the procedure report that was given to you for any specific questions about what was found during the examination.  If the procedure report does not answer your questions, please call your gastroenterologist to clarify.  If you requested that your care partner not be given the details of your procedure findings, then the procedure report has been included in a sealed envelope for you to review at your convenience later.  YOU SHOULD EXPECT: Some feelings of bloating in the abdomen. Passage of more gas than usual.  Walking can help get rid of the air that was put into your GI tract during the procedure and reduce the bloating. If you had a lower endoscopy (such as a colonoscopy or flexible sigmoidoscopy) you may notice spotting of blood in your stool or on the toilet paper. If you underwent a bowel prep for your procedure, you may not have a normal bowel movement for a few days.  Please Note:  You might notice some irritation and congestion in your nose or some drainage.  This is from the oxygen used during your procedure.  There is no need for concern and it should clear up in a day or so.  SYMPTOMS TO REPORT IMMEDIATELY:   Following upper endoscopy (EGD)  Vomiting of blood or coffee ground material  New chest pain or pain under the shoulder blades  Painful or persistently difficult swallowing  New shortness of breath  Fever of 100F or higher  Black, tarry-looking stools  For urgent or emergent issues, a gastroenterologist can be reached at any hour by calling [phone]. Do not use MyChart messaging for urgent concerns.    DIET:  We do recommend a small meal at first, but then you may proceed to your regular diet.  Drink plenty of fluids but you should avoid alcoholic beverages for 24 hours.  ACTIVITY:  You should plan to take it easy for  the rest of today and you should NOT DRIVE or use heavy machinery until tomorrow (because of the sedation medicines used during the test).    FOLLOW UP: Our staff will call the number listed on your records 48-72 hours following your procedure to check on you and address any questions or concerns that you may have regarding the information given to you following your procedure. If we do not reach you, we will leave a message.  We will attempt to reach you two times.  During this call, we will ask if you have developed any symptoms of COVID 19. If you develop any symptoms (ie: fever, flu-like symptoms, shortness of breath, cough etc.) before then, please call [phone].  If you test positive for Covid 19 in the 2 weeks post procedure, please call and report this information to [place].    If any biopsies were taken you will be contacted by phone or by letter within the next 1-3 weeks.  Please call us at [phone] if you have not heard about the biopsies in 3 weeks.    SIGNATURES/CONFIDENTIALITY: You and/or your care partner have signed paperwork which will be entered into your electronic medical record.  These signatures attest to the fact that that the information above on your After Visit Summary has been reviewed and is understood.  Full responsibility of the confidentiality of this discharge information lies with you and/or your care-partner.

## 2020-11-22 NOTE — Progress Notes (Signed)
PT taken to PACU. Monitors in place. VSS. Report given to RN. 

## 2020-11-22 NOTE — Progress Notes (Signed)
Called to room to assist during endoscopic procedure.  Patient ID and intended procedure confirmed with present staff. Received instructions for my participation in the procedure from the performing physician.  

## 2020-11-22 NOTE — Progress Notes (Signed)
Vital signs by CW.

## 2020-11-27 ENCOUNTER — Telehealth: Payer: Self-pay

## 2020-11-27 NOTE — Telephone Encounter (Signed)
LVM

## 2020-12-04 ENCOUNTER — Encounter: Payer: Self-pay | Admitting: Gastroenterology

## 2020-12-06 ENCOUNTER — Ambulatory Visit
Admission: RE | Admit: 2020-12-06 | Discharge: 2020-12-06 | Disposition: A | Discharge status: Home / Self Care | Payer: BC Managed Care – PPO | Source: Ambulatory Visit | Attending: Family Medicine | Admitting: Family Medicine

## 2020-12-06 ENCOUNTER — Other Ambulatory Visit: Payer: Self-pay

## 2020-12-06 DIAGNOSIS — Z1231 Encounter for screening mammogram for malignant neoplasm of breast: Secondary | ICD-10-CM

## 2021-06-16 DIAGNOSIS — E7800 Pure hypercholesterolemia, unspecified: Secondary | ICD-10-CM | POA: Diagnosis not present

## 2021-06-16 DIAGNOSIS — R7303 Prediabetes: Secondary | ICD-10-CM | POA: Diagnosis not present

## 2021-08-11 DIAGNOSIS — Z85828 Personal history of other malignant neoplasm of skin: Secondary | ICD-10-CM | POA: Diagnosis not present

## 2021-08-11 DIAGNOSIS — D225 Melanocytic nevi of trunk: Secondary | ICD-10-CM | POA: Diagnosis not present

## 2021-08-11 DIAGNOSIS — L570 Actinic keratosis: Secondary | ICD-10-CM | POA: Diagnosis not present

## 2021-08-11 DIAGNOSIS — D2239 Melanocytic nevi of other parts of face: Secondary | ICD-10-CM | POA: Diagnosis not present

## 2021-08-11 DIAGNOSIS — D485 Neoplasm of uncertain behavior of skin: Secondary | ICD-10-CM | POA: Diagnosis not present

## 2021-08-11 DIAGNOSIS — D2261 Melanocytic nevi of right upper limb, including shoulder: Secondary | ICD-10-CM | POA: Diagnosis not present

## 2021-09-06 DIAGNOSIS — J019 Acute sinusitis, unspecified: Secondary | ICD-10-CM | POA: Diagnosis not present

## 2021-10-02 DIAGNOSIS — M79671 Pain in right foot: Secondary | ICD-10-CM | POA: Diagnosis not present

## 2021-10-22 DIAGNOSIS — M79671 Pain in right foot: Secondary | ICD-10-CM | POA: Diagnosis not present

## 2021-10-24 ENCOUNTER — Other Ambulatory Visit: Payer: Self-pay | Admitting: Orthopaedic Surgery

## 2021-10-24 DIAGNOSIS — M79671 Pain in right foot: Secondary | ICD-10-CM

## 2021-11-05 ENCOUNTER — Ambulatory Visit
Admission: RE | Admit: 2021-11-05 | Discharge: 2021-11-05 | Disposition: A | Discharge status: Home / Self Care | Payer: BC Managed Care – PPO | Source: Ambulatory Visit | Attending: Orthopaedic Surgery | Admitting: Orthopaedic Surgery

## 2021-11-05 ENCOUNTER — Other Ambulatory Visit: Payer: Self-pay

## 2021-11-05 DIAGNOSIS — R600 Localized edema: Secondary | ICD-10-CM | POA: Diagnosis not present

## 2021-11-05 DIAGNOSIS — M19071 Primary osteoarthritis, right ankle and foot: Secondary | ICD-10-CM | POA: Diagnosis not present

## 2021-11-05 DIAGNOSIS — M79671 Pain in right foot: Secondary | ICD-10-CM

## 2021-11-05 DIAGNOSIS — M25474 Effusion, right foot: Secondary | ICD-10-CM | POA: Diagnosis not present

## 2021-11-05 DIAGNOSIS — M2011 Hallux valgus (acquired), right foot: Secondary | ICD-10-CM | POA: Diagnosis not present

## 2021-11-10 DIAGNOSIS — M79671 Pain in right foot: Secondary | ICD-10-CM | POA: Diagnosis not present

## 2021-12-10 ENCOUNTER — Other Ambulatory Visit: Payer: Self-pay | Admitting: Family Medicine

## 2021-12-10 DIAGNOSIS — Z1231 Encounter for screening mammogram for malignant neoplasm of breast: Secondary | ICD-10-CM

## 2021-12-10 DIAGNOSIS — E7800 Pure hypercholesterolemia, unspecified: Secondary | ICD-10-CM | POA: Diagnosis not present

## 2021-12-10 DIAGNOSIS — E2839 Other primary ovarian failure: Secondary | ICD-10-CM

## 2021-12-10 DIAGNOSIS — K219 Gastro-esophageal reflux disease without esophagitis: Secondary | ICD-10-CM | POA: Diagnosis not present

## 2021-12-10 DIAGNOSIS — R7303 Prediabetes: Secondary | ICD-10-CM | POA: Diagnosis not present

## 2021-12-10 DIAGNOSIS — Z0000 Encounter for general adult medical examination without abnormal findings: Secondary | ICD-10-CM | POA: Diagnosis not present

## 2021-12-10 DIAGNOSIS — Z23 Encounter for immunization: Secondary | ICD-10-CM | POA: Diagnosis not present

## 2021-12-12 ENCOUNTER — Other Ambulatory Visit: Payer: Self-pay | Admitting: Family Medicine

## 2021-12-12 ENCOUNTER — Ambulatory Visit
Admission: RE | Admit: 2021-12-12 | Discharge: 2021-12-12 | Disposition: A | Discharge status: Home / Self Care | Payer: BC Managed Care – PPO | Source: Ambulatory Visit | Attending: Family Medicine | Admitting: Family Medicine

## 2021-12-12 DIAGNOSIS — Z1231 Encounter for screening mammogram for malignant neoplasm of breast: Secondary | ICD-10-CM

## 2022-03-12 DIAGNOSIS — R300 Dysuria: Secondary | ICD-10-CM | POA: Diagnosis not present

## 2022-03-12 DIAGNOSIS — R350 Frequency of micturition: Secondary | ICD-10-CM | POA: Diagnosis not present

## 2022-04-28 ENCOUNTER — Other Ambulatory Visit: Payer: Self-pay | Admitting: Family Medicine

## 2022-04-28 DIAGNOSIS — R1011 Right upper quadrant pain: Secondary | ICD-10-CM

## 2022-04-30 ENCOUNTER — Other Ambulatory Visit: Payer: BC Managed Care – PPO

## 2022-05-04 ENCOUNTER — Ambulatory Visit
Admission: RE | Admit: 2022-05-04 | Discharge: 2022-05-04 | Disposition: A | Discharge status: Home / Self Care | Payer: BC Managed Care – PPO | Source: Ambulatory Visit | Attending: Family Medicine | Admitting: Family Medicine

## 2022-05-04 DIAGNOSIS — R1011 Right upper quadrant pain: Secondary | ICD-10-CM | POA: Diagnosis not present

## 2022-05-04 DIAGNOSIS — K824 Cholesterolosis of gallbladder: Secondary | ICD-10-CM | POA: Diagnosis not present

## 2022-06-02 ENCOUNTER — Other Ambulatory Visit: Payer: BC Managed Care – PPO

## 2022-10-07 DIAGNOSIS — C44519 Basal cell carcinoma of skin of other part of trunk: Secondary | ICD-10-CM | POA: Diagnosis not present

## 2022-10-07 DIAGNOSIS — K130 Diseases of lips: Secondary | ICD-10-CM | POA: Diagnosis not present

## 2022-10-07 DIAGNOSIS — D2239 Melanocytic nevi of other parts of face: Secondary | ICD-10-CM | POA: Diagnosis not present

## 2022-10-07 DIAGNOSIS — Z85828 Personal history of other malignant neoplasm of skin: Secondary | ICD-10-CM | POA: Diagnosis not present

## 2022-10-07 DIAGNOSIS — D485 Neoplasm of uncertain behavior of skin: Secondary | ICD-10-CM | POA: Diagnosis not present

## 2022-10-07 DIAGNOSIS — I788 Other diseases of capillaries: Secondary | ICD-10-CM | POA: Diagnosis not present

## 2022-10-07 DIAGNOSIS — L570 Actinic keratosis: Secondary | ICD-10-CM | POA: Diagnosis not present

## 2022-10-20 DIAGNOSIS — C44519 Basal cell carcinoma of skin of other part of trunk: Secondary | ICD-10-CM | POA: Diagnosis not present

## 2022-11-26 ENCOUNTER — Encounter: Payer: Self-pay | Admitting: Gastroenterology

## 2022-11-26 DIAGNOSIS — L308 Other specified dermatitis: Secondary | ICD-10-CM | POA: Diagnosis not present

## 2022-11-26 DIAGNOSIS — Z85828 Personal history of other malignant neoplasm of skin: Secondary | ICD-10-CM | POA: Diagnosis not present

## 2022-11-26 DIAGNOSIS — K130 Diseases of lips: Secondary | ICD-10-CM | POA: Diagnosis not present

## 2022-11-26 DIAGNOSIS — L570 Actinic keratosis: Secondary | ICD-10-CM | POA: Diagnosis not present

## 2022-11-26 DIAGNOSIS — D485 Neoplasm of uncertain behavior of skin: Secondary | ICD-10-CM | POA: Diagnosis not present

## 2022-12-09 ENCOUNTER — Encounter: Payer: Self-pay | Admitting: Gastroenterology

## 2023-01-08 DIAGNOSIS — Z85828 Personal history of other malignant neoplasm of skin: Secondary | ICD-10-CM | POA: Diagnosis not present

## 2023-01-08 DIAGNOSIS — L570 Actinic keratosis: Secondary | ICD-10-CM | POA: Diagnosis not present

## 2023-02-09 ENCOUNTER — Ambulatory Visit (AMBULATORY_SURGERY_CENTER): Payer: BC Managed Care – PPO | Admitting: *Deleted

## 2023-02-09 VITALS — Ht 65.0 in | Wt 135.0 lb

## 2023-02-09 DIAGNOSIS — Z800 Family history of malignant neoplasm of digestive organs: Secondary | ICD-10-CM

## 2023-02-09 DIAGNOSIS — Z86010 Personal history of colonic polyps: Secondary | ICD-10-CM

## 2023-02-09 MED ORDER — NA SULFATE-K SULFATE-MG SULF 17.5-3.13-1.6 GM/177ML PO SOLN: 1.0000 | Freq: Once | ORAL | 0 refills | Status: AC

## 2023-02-09 NOTE — Progress Notes (Signed)
Pt's name and DOB verified at the beginning of the pre-visit.  Pt denies any difficulty with ambulating,sitting, laying down or rolling side to side Gave both [facility] main # and MD on call # prior to instructions.  No egg or soy allergy known to patient  No issues known to pt with past sedation with any surgeries or procedures  PONV Pt has no issues moving head neck or  has a  little difficulty swallowing No FH of Malignant Hyperthermia Pt is not on diet pills Pt is not on home 02  Pt is not on blood thinners  Pt denies issues with constipation  Pt is not on dialysis Pt denise any abnormal heart rhythms  Pt denies any upcoming cardiac testing Pt encouraged to use to use Singlecare or Goodrx to reduce cost  Patient's chart reviewed by Cathlyn Parsons CNRA prior to pre-visit and patient appropriate for the [facility].  Pre-visit completed and red dot placed by patient's name on their procedure day (on provider's schedule).  . Visit by phone Pt states weight is 135 Instructed pt why it is important to and  to call if they have any changes in health or new medications. Directed them to the # given and on instructions.   Pt states they will.  Instructions reviewed with pt and pt states understanding. Instructed to review again prior to procedure. Pt states they will.  Instructions sent by mail with coupon and by my chart

## 2023-02-23 ENCOUNTER — Encounter: Payer: Self-pay | Admitting: Gastroenterology

## 2023-03-04 ENCOUNTER — Encounter: Payer: Self-pay | Admitting: Certified Registered Nurse Anesthetist

## 2023-03-09 ENCOUNTER — Encounter: Payer: Self-pay | Admitting: Gastroenterology

## 2023-03-09 ENCOUNTER — Ambulatory Visit (AMBULATORY_SURGERY_CENTER): Payer: BC Managed Care – PPO | Admitting: Gastroenterology

## 2023-03-09 VITALS — BP 109/68 | HR 55 | Temp 97.8°F | Resp 13 | Ht 64.0 in | Wt 135.0 lb

## 2023-03-09 DIAGNOSIS — Z800 Family history of malignant neoplasm of digestive organs: Secondary | ICD-10-CM

## 2023-03-09 DIAGNOSIS — Z86010 Personal history of colonic polyps: Secondary | ICD-10-CM | POA: Diagnosis not present

## 2023-03-09 DIAGNOSIS — Z09 Encounter for follow-up examination after completed treatment for conditions other than malignant neoplasm: Secondary | ICD-10-CM | POA: Diagnosis not present

## 2023-03-09 DIAGNOSIS — Z1211 Encounter for screening for malignant neoplasm of colon: Secondary | ICD-10-CM | POA: Diagnosis not present

## 2023-03-09 MED ORDER — SODIUM CHLORIDE 0.9 % IV SOLN: 500.0000 mL | Freq: Once | INTRAVENOUS | Status: DC

## 2023-03-09 NOTE — Progress Notes (Signed)
Pt's states no medical or surgical changes since previsit or office visit. 

## 2023-03-09 NOTE — Patient Instructions (Signed)
Resume previous diet and medications. Repeat Colonoscopy in 5 years for surveillance. Handouts provided on Diverticulosis and Hemorrhoids.  YOU HAD AN ENDOSCOPIC PROCEDURE TODAY AT THE  ENDOSCOPY CENTER:   Refer to the procedure report that was given to you for any specific questions about what was found during the examination.  If the procedure report does not answer your questions, please call your gastroenterologist to clarify.  If you requested that your care partner not be given the details of your procedure findings, then the procedure report has been included in a sealed envelope for you to review at your convenience later.  YOU SHOULD EXPECT: Some feelings of bloating in the abdomen. Passage of more gas than usual.  Walking can help get rid of the air that was put into your GI tract during the procedure and reduce the bloating. If you had a lower endoscopy (such as a colonoscopy or flexible sigmoidoscopy) you may notice spotting of blood in your stool or on the toilet paper. If you underwent a bowel prep for your procedure, you may not have a normal bowel movement for a few days.  Please Note:  You might notice some irritation and congestion in your nose or some drainage.  This is from the oxygen used during your procedure.  There is no need for concern and it should clear up in a day or so.  SYMPTOMS TO REPORT IMMEDIATELY:  Following lower endoscopy (colonoscopy or flexible sigmoidoscopy):  Excessive amounts of blood in the stool  Significant tenderness or worsening of abdominal pains  Swelling of the abdomen that is new, acute  Fever of 100F or higher  For urgent or emergent issues, a gastroenterologist can be reached at any hour by calling [phone]. Do not use MyChart messaging for urgent concerns.    DIET:  We do recommend a small meal at first, but then you may proceed to your regular diet.  Drink plenty of fluids but you should avoid alcoholic beverages for 24  hours.  ACTIVITY:  You should plan to take it easy for the rest of today and you should NOT DRIVE or use heavy machinery until tomorrow (because of the sedation medicines used during the test).    FOLLOW UP: Our staff will call the number listed on your records the next business day following your procedure.  We will call around 7:15- 8:00 am to check on you and address any questions or concerns that you may have regarding the information given to you following your procedure. If we do not reach you, we will leave a message.     If any biopsies were taken you will be contacted by phone or by letter within the next 1-3 weeks.  Please call us at [phone] if you have not heard about the biopsies in 3 weeks.    SIGNATURES/CONFIDENTIALITY: You and/or your care partner have signed paperwork which will be entered into your electronic medical record.  These signatures attest to the fact that that the information above on your After Visit Summary has been reviewed and is understood.  Full responsibility of the confidentiality of this discharge information lies with you and/or your care-partner. 

## 2023-03-09 NOTE — Op Note (Signed)
[facility] Patient Name: Amber Singleton Procedure Date: 03/09/2023 8:54 AM MRN: [id number] Endoscopist: Meryl Dare , MD, [id number] Age: 62 Referring MD:  Date of Birth: 11-01-1960 Gender: Female Account #: [id number] Procedure:                Colonoscopy Indications:              Screening for colon cancer: Family history of                            colorectal cancer in multiple 1st degree and 2nd                            degree relatives, Personal history of adenomatous                            and sessile serrated colon polyp in 2016. Medicines:                Monitored Anesthesia Care Procedure:                Pre-Anesthesia Assessment:                           - Prior to the procedure, a History and Physical                            was performed, and patient medications and                            allergies were reviewed. The patient's tolerance of                            previous anesthesia was also reviewed. The risks                            and benefits of the procedure and the sedation                            options and risks were discussed with the patient.                            All questions were answered, and informed consent                            was obtained. Prior Anticoagulants: The patient has                            taken no anticoagulant or antiplatelet agents. ASA                            Grade Assessment: II - A patient with mild systemic                            disease. After reviewing the risks and benefits,  the patient was deemed in satisfactory condition to                            undergo the procedure.                           After obtaining informed consent, the colonoscope                            was passed under direct vision. Throughout the                            procedure, the patient's blood pressure, pulse, and                            oxygen saturations  were monitored continuously. The                            Olympus PCF-H190DL ([id number]) Colonoscope was                            introduced through the anus and advanced to the the                            cecum, identified by appendiceal orifice and                            ileocecal valve. The ileocecal valve, appendiceal                            orifice, and rectum were photographed. The quality                            of the bowel preparation was good. The colonoscopy                            was performed without difficulty. The patient                            tolerated the procedure well. Scope In: 8:58:00 AM Scope Out: 9:10:21 AM Scope Withdrawal Time: 0 hours 8 minutes 30 seconds  Total Procedure Duration: 0 hours 12 minutes 21 seconds  Findings:                 The perianal and digital rectal examinations were                            normal.                           A few small-mouthed diverticula were found in the                            left colon.  External and internal hemorrhoids were found during                            retroflexion. The hemorrhoids were small and Grade                            I (internal hemorrhoids that do not prolapse).                           The exam was otherwise without abnormality on                            direct and retroflexion views. Complications:            No immediate complications. Estimated blood loss:                            None. Estimated Blood Loss:     Estimated blood loss: none. Impression:               - Diverticulosis in the left colon.                           - External and internal hemorrhoids.                           - The examination was otherwise normal on direct                            and retroflexion views.                           - No specimens collected. Recommendation:           - Repeat colonoscopy in 5 years for surveillance.                            - Patient has a contact number available for                            emergencies. The signs and symptoms of potential                            delayed complications were discussed with the                            patient. Return to normal activities tomorrow.                            Written discharge instructions were provided to the                            patient.                           - Resume previous diet.                           -  Continue present medications.                           - Await pathology results. Meryl Dare, MD 03/09/2023 9:13:38 AM This report has been signed electronically.

## 2023-03-09 NOTE — Progress Notes (Signed)
History & Physical  Primary Care Physician:  Maurice Small, MD (Inactive) Primary Gastroenterologist: Claudette Head, MD  Impression / Plan:  Family history of colon cancer, multiple second-degree relatives and history of sessile serrated and adenomatous colon polyps in 2016 for colonoscopy.  CHIEF COMPLAINT:  FHCC, Personal history of colon polyps   HPI: Amber Singleton is a 62 y.o. female with a family history of colon cancer, multiple second-degree relatives and history of sessile serrated and adenomatous colon polyps in 2016 for colonoscopy.   Past Medical History:  Diagnosis Date   Allergy    Arthritis    Bursitis of hip    Bil   Environmental allergies    GERD (gastroesophageal reflux disease)    Hyperlipidemia    tx with diet   IBS (irritable bowel syndrome)    IC (interstitial cystitis)    MS (multiple sclerosis) (HCC)    Narrowed leaky pulmonary heart valve    Neuromuscular disorder (HCC)    MS   Post-operative nausea and vomiting     Past Surgical History:  Procedure Laterality Date   ABDOMINAL HYSTERECTOMY     AUGMENTATION MAMMAPLASTY Bilateral    BREAST SURGERY     breast augmentation   CESAREAN SECTION     x 2   CHONDROPLASTY Right 05/29/2015   Procedure: CHONDROPLASTY MEDIAL FEMORAL CONDYLE AND PATELLOFEMORAL JOINT;  Surgeon: Jodi Geralds, MD;  Location: [facility];  Service: Orthopedics;  Laterality: Right;   COLONOSCOPY     colonscopy     x 2   DILATION AND CURETTAGE OF UTERUS  12/2007   KNEE ARTHROSCOPY WITH EXCISION PLICA Right 05/29/2015   Procedure: MEDIAL SHELF PLICA EXCISION;  Surgeon: Jodi Geralds, MD;  Location: [facility];  Service: Orthopedics;  Laterality: Right;   KNEE ARTHROSCOPY WITH MEDIAL MENISECTOMY Right 05/29/2015   Procedure: RIGHT KNEE ARTHROSCOPY WITH PARTIAL MEDIAL MENISECTOMY;  Surgeon: Jodi Geralds, MD;  Location: [facility];  Service: Orthopedics;  Laterality: Right;   LIPOSUCTION  MULTIPLE BODY PARTS     NOVASURE ABLATION  12/2007   POLYPECTOMY     RHINOPLASTY     TUBAL LIGATION     UPPER GASTROINTESTINAL ENDOSCOPY     WISDOM TOOTH EXTRACTION      Prior to Admission medications   Medication Sig Start Date End Date Taking? Authorizing Provider  Multiple Vitamins-Minerals (MULTIVITAMIN WITH MINERALS) tablet Take 1 tablet by mouth daily.   Yes [provider]  Cholecalciferol (VITAMIN D) 50 MCG (2000 UT) CAPS Take 1 capsule by mouth daily.    [provider]  DiphenhydrAMINE HCl (BENADRYL ALLERGY PO) Take 1 tablet by mouth daily as needed. For allergies.    [provider]  estradiol (ESTRACE) 0.1 MG/GM vaginal cream as directed. Patient not taking: Reported on 02/09/2023 01/03/20   [provider]  hydrocortisone 2.5 % ointment Apply topically 2 (two) times daily. 10/07/22   [provider]  Ibuprofen 200 MG CAPS Take 1 capsule by mouth as directed. Patient not taking: Reported on 02/09/2023    [provider]    Current Outpatient Medications  Medication Sig Dispense Refill   Multiple Vitamins-Minerals (MULTIVITAMIN WITH MINERALS) tablet Take 1 tablet by mouth daily.     Cholecalciferol (VITAMIN D) 50 MCG (2000 UT) CAPS Take 1 capsule by mouth daily.     DiphenhydrAMINE HCl (BENADRYL ALLERGY PO) Take 1 tablet by mouth daily as needed. For allergies.     estradiol (ESTRACE) 0.1  MG/GM vaginal cream as directed. (Patient not taking: Reported on 02/09/2023)     hydrocortisone 2.5 % ointment Apply topically 2 (two) times daily.     Ibuprofen 200 MG CAPS Take 1 capsule by mouth as directed. (Patient not taking: Reported on 02/09/2023)     Current Facility-Administered Medications  Medication Dose Route Frequency Provider Last Rate Last Admin   0.9 %  sodium chloride infusion  500 mL Intravenous Once Meryl Dare, MD        Allergies as of 03/09/2023 - Review Complete 03/09/2023  Allergen Reaction Noted    Penicillins Anaphylaxis 10/06/2011   Ciprofloxacin  11/20/2020   Clindamycin/lincomycin Other (See Comments) 02/09/2023   Codeine Nausea Only 10/06/2011   Darvocet [propoxyphene n-acetaminophen] Nausea Only 10/06/2011   Elemental sulfur  09/25/2014   Septra [sulfamethoxazole-trimethoprim] Nausea Only 11/20/2020    Family History  Problem Relation Age of Onset   Breast cancer Mother 49   Colon polyps Mother    Lymphoma Father    Colon cancer Maternal Grandmother    Colon cancer Maternal Grandfather    Diabetes Brother    Stomach cancer Neg Hx    Esophageal cancer Neg Hx    Rectal cancer Neg Hx     Social History   Socioeconomic History   Marital status: Married    Spouse name: Not on file   Number of children: Not on file   Years of education: Not on file   Highest education level: Not on file  Occupational History   Not on file  Tobacco Use   Smoking status: Former    Types: Cigarettes    Quit date: 1982    Years since quitting: 42.5   Smokeless tobacco: Never  Vaping Use   Vaping Use: Never used  Substance and Sexual Activity   Alcohol use: Yes    Alcohol/week: 3.0 - 4.0 standard drinks of alcohol    Types: 3 - 4 Glasses of wine per week    Comment: wine 3-4 times per week   Drug use: No   Sexual activity: Yes    Birth control/protection: Surgical  Other Topics Concern   Not on file  Social History Narrative   Not on file   Social Determinants of Health   Financial Resource Strain: Not on file  Food Insecurity: Not on file  Transportation Needs: Not on file  Physical Activity: Not on file  Stress: Not on file  Social Connections: Not on file  Intimate Partner Violence: Not on file    Review of Systems:  All systems reviewed were negative except where noted in HPI.   Physical Exam:  General:  Alert, well-developed, in NAD Head:  Normocephalic and atraumatic. Eyes:  Sclera clear, no icterus.   Conjunctiva pink. Ears:  Normal auditory  acuity. Mouth:  No deformity or lesions.  Neck:  Supple; no masses. Lungs:  Clear throughout to auscultation.   No wheezes, crackles, or rhonchi.  Heart:  Regular rate and rhythm; no murmurs. Abdomen:  Soft, nondistended, nontender. No masses, hepatomegaly. No palpable masses.  Normal bowel sounds.    Rectal:  Deferred   Msk:  Symmetrical without gross deformities. Extremities:  Without edema. Neurologic:  Alert and  oriented x 4; grossly normal neurologically. Skin:  Intact without significant lesions or rashes. Psych:  Alert and cooperative. Normal mood and affect.    Venita Lick. Russella Dar  03/09/2023, 8:51 AM See Loretha Stapler, [facility], to contact our on call provider

## 2023-03-09 NOTE — Progress Notes (Signed)
Report given to PACU, vss 

## 2023-03-10 ENCOUNTER — Telehealth: Payer: Self-pay | Admitting: *Deleted

## 2023-03-10 NOTE — Telephone Encounter (Signed)
  Follow up Call-    Row Labels 03/09/2023    8:25 AM 11/22/2020   10:11 AM  Call back number   Section Header. No data exists in this row.    Post procedure Call Back phone  #   [phone]  Permission to leave phone message   Yes Yes     Patient questions:  Do you have a fever, pain , or abdominal swelling? No. Pain Score  0 *  Have you tolerated food without any problems? Yes.    Have you been able to return to your normal activities? Yes.    Do you have any questions about your discharge instructions: Diet   No. Medications  No. Follow up visit  No.  Do you have questions or concerns about your Care? No.  Actions: * If pain score is 4 or above: No action needed, pain <4.

## 2023-05-03 ENCOUNTER — Other Ambulatory Visit: Payer: Self-pay | Admitting: Internal Medicine

## 2023-05-03 DIAGNOSIS — Z1231 Encounter for screening mammogram for malignant neoplasm of breast: Secondary | ICD-10-CM

## 2023-05-11 DIAGNOSIS — R051 Acute cough: Secondary | ICD-10-CM | POA: Diagnosis not present

## 2023-05-19 ENCOUNTER — Ambulatory Visit: Payer: BC Managed Care – PPO

## 2023-05-19 ENCOUNTER — Other Ambulatory Visit: Payer: Self-pay | Admitting: Internal Medicine

## 2023-05-19 DIAGNOSIS — R002 Palpitations: Secondary | ICD-10-CM | POA: Diagnosis not present

## 2023-05-19 DIAGNOSIS — I499 Cardiac arrhythmia, unspecified: Secondary | ICD-10-CM | POA: Diagnosis not present

## 2023-05-19 DIAGNOSIS — R0789 Other chest pain: Secondary | ICD-10-CM | POA: Diagnosis not present

## 2023-05-19 DIAGNOSIS — K824 Cholesterolosis of gallbladder: Secondary | ICD-10-CM

## 2023-05-19 DIAGNOSIS — I38 Endocarditis, valve unspecified: Secondary | ICD-10-CM | POA: Diagnosis not present

## 2023-05-31 ENCOUNTER — Ambulatory Visit
Admission: RE | Admit: 2023-05-31 | Discharge: 2023-05-31 | Disposition: A | Discharge status: Home / Self Care | Payer: BC Managed Care – PPO | Source: Ambulatory Visit | Attending: Internal Medicine

## 2023-05-31 DIAGNOSIS — K824 Cholesterolosis of gallbladder: Secondary | ICD-10-CM

## 2023-06-04 DIAGNOSIS — I340 Nonrheumatic mitral (valve) insufficiency: Secondary | ICD-10-CM | POA: Diagnosis not present

## 2023-06-04 DIAGNOSIS — H00022 Hordeolum internum right lower eyelid: Secondary | ICD-10-CM | POA: Diagnosis not present

## 2023-06-07 DIAGNOSIS — R002 Palpitations: Secondary | ICD-10-CM | POA: Diagnosis not present

## 2023-06-09 DIAGNOSIS — I340 Nonrheumatic mitral (valve) insufficiency: Secondary | ICD-10-CM | POA: Diagnosis not present

## 2023-07-09 DIAGNOSIS — L821 Other seborrheic keratosis: Secondary | ICD-10-CM | POA: Diagnosis not present

## 2023-07-09 DIAGNOSIS — D2262 Melanocytic nevi of left upper limb, including shoulder: Secondary | ICD-10-CM | POA: Diagnosis not present

## 2023-07-09 DIAGNOSIS — Z85828 Personal history of other malignant neoplasm of skin: Secondary | ICD-10-CM | POA: Diagnosis not present

## 2023-07-09 DIAGNOSIS — L570 Actinic keratosis: Secondary | ICD-10-CM | POA: Diagnosis not present

## 2023-07-21 ENCOUNTER — Ambulatory Visit: Payer: BC Managed Care – PPO

## 2023-07-21 DIAGNOSIS — G35 Multiple sclerosis: Secondary | ICD-10-CM | POA: Diagnosis not present

## 2023-07-21 DIAGNOSIS — E7800 Pure hypercholesterolemia, unspecified: Secondary | ICD-10-CM | POA: Diagnosis not present

## 2023-07-21 DIAGNOSIS — R7303 Prediabetes: Secondary | ICD-10-CM | POA: Diagnosis not present

## 2023-07-21 DIAGNOSIS — Z0000 Encounter for general adult medical examination without abnormal findings: Secondary | ICD-10-CM | POA: Diagnosis not present

## 2023-08-17 ENCOUNTER — Ambulatory Visit
Admission: RE | Admit: 2023-08-17 | Discharge: 2023-08-17 | Disposition: A | Discharge status: Home / Self Care | Payer: BC Managed Care – PPO | Source: Ambulatory Visit | Attending: Internal Medicine | Admitting: Internal Medicine

## 2023-08-17 DIAGNOSIS — L570 Actinic keratosis: Secondary | ICD-10-CM | POA: Diagnosis not present

## 2023-08-17 DIAGNOSIS — Z1231 Encounter for screening mammogram for malignant neoplasm of breast: Secondary | ICD-10-CM | POA: Diagnosis not present

## 2023-08-17 DIAGNOSIS — L282 Other prurigo: Secondary | ICD-10-CM | POA: Diagnosis not present

## 2023-08-17 DIAGNOSIS — L218 Other seborrheic dermatitis: Secondary | ICD-10-CM | POA: Diagnosis not present

## 2023-08-17 DIAGNOSIS — Z85828 Personal history of other malignant neoplasm of skin: Secondary | ICD-10-CM | POA: Diagnosis not present

## 2023-10-05 DIAGNOSIS — L218 Other seborrheic dermatitis: Secondary | ICD-10-CM | POA: Diagnosis not present

## 2023-10-05 DIAGNOSIS — Z85828 Personal history of other malignant neoplasm of skin: Secondary | ICD-10-CM | POA: Diagnosis not present

## 2023-10-05 DIAGNOSIS — L570 Actinic keratosis: Secondary | ICD-10-CM | POA: Diagnosis not present

## 2023-11-09 DIAGNOSIS — R300 Dysuria: Secondary | ICD-10-CM | POA: Diagnosis not present

## 2023-11-25 DIAGNOSIS — R3915 Urgency of urination: Secondary | ICD-10-CM | POA: Diagnosis not present

## 2023-11-25 DIAGNOSIS — R300 Dysuria: Secondary | ICD-10-CM | POA: Diagnosis not present

## 2023-11-25 DIAGNOSIS — R8271 Bacteriuria: Secondary | ICD-10-CM | POA: Diagnosis not present

## 2023-11-25 DIAGNOSIS — N952 Postmenopausal atrophic vaginitis: Secondary | ICD-10-CM | POA: Diagnosis not present

## 2023-12-01 DIAGNOSIS — N3020 Other chronic cystitis without hematuria: Secondary | ICD-10-CM | POA: Diagnosis not present

## 2023-12-21 DIAGNOSIS — N3020 Other chronic cystitis without hematuria: Secondary | ICD-10-CM | POA: Diagnosis not present

## 2023-12-21 DIAGNOSIS — R300 Dysuria: Secondary | ICD-10-CM | POA: Diagnosis not present

## 2023-12-30 DIAGNOSIS — L578 Other skin changes due to chronic exposure to nonionizing radiation: Secondary | ICD-10-CM | POA: Diagnosis not present

## 2023-12-30 DIAGNOSIS — C44319 Basal cell carcinoma of skin of other parts of face: Secondary | ICD-10-CM | POA: Diagnosis not present

## 2023-12-30 DIAGNOSIS — L218 Other seborrheic dermatitis: Secondary | ICD-10-CM | POA: Diagnosis not present

## 2023-12-30 DIAGNOSIS — D0372 Melanoma in situ of left lower limb, including hip: Secondary | ICD-10-CM | POA: Diagnosis not present

## 2023-12-30 DIAGNOSIS — Z85828 Personal history of other malignant neoplasm of skin: Secondary | ICD-10-CM | POA: Diagnosis not present

## 2023-12-30 DIAGNOSIS — L814 Other melanin hyperpigmentation: Secondary | ICD-10-CM | POA: Diagnosis not present

## 2023-12-30 DIAGNOSIS — C441192 Basal cell carcinoma of skin of left lower eyelid, including canthus: Secondary | ICD-10-CM | POA: Diagnosis not present

## 2024-01-18 DIAGNOSIS — L988 Other specified disorders of the skin and subcutaneous tissue: Secondary | ICD-10-CM | POA: Diagnosis not present

## 2024-01-18 DIAGNOSIS — D0372 Melanoma in situ of left lower limb, including hip: Secondary | ICD-10-CM | POA: Diagnosis not present

## 2024-02-28 DIAGNOSIS — C44319 Basal cell carcinoma of skin of other parts of face: Secondary | ICD-10-CM | POA: Diagnosis not present

## 2024-04-10 DIAGNOSIS — N3020 Other chronic cystitis without hematuria: Secondary | ICD-10-CM | POA: Diagnosis not present

## 2024-04-10 DIAGNOSIS — N952 Postmenopausal atrophic vaginitis: Secondary | ICD-10-CM | POA: Diagnosis not present

## 2024-04-11 DIAGNOSIS — D2262 Melanocytic nevi of left upper limb, including shoulder: Secondary | ICD-10-CM | POA: Diagnosis not present

## 2024-04-11 DIAGNOSIS — Z85828 Personal history of other malignant neoplasm of skin: Secondary | ICD-10-CM | POA: Diagnosis not present

## 2024-04-11 DIAGNOSIS — L570 Actinic keratosis: Secondary | ICD-10-CM | POA: Diagnosis not present

## 2024-04-11 DIAGNOSIS — D2261 Melanocytic nevi of right upper limb, including shoulder: Secondary | ICD-10-CM | POA: Diagnosis not present

## 2024-04-11 DIAGNOSIS — D225 Melanocytic nevi of trunk: Secondary | ICD-10-CM | POA: Diagnosis not present

## 2024-05-03 DIAGNOSIS — L299 Pruritus, unspecified: Secondary | ICD-10-CM | POA: Diagnosis not present

## 2024-05-03 DIAGNOSIS — B029 Zoster without complications: Secondary | ICD-10-CM | POA: Diagnosis not present

## 2024-05-10 DIAGNOSIS — Z780 Asymptomatic menopausal state: Secondary | ICD-10-CM | POA: Diagnosis not present

## 2024-05-10 DIAGNOSIS — N952 Postmenopausal atrophic vaginitis: Secondary | ICD-10-CM | POA: Diagnosis not present

## 2024-05-10 DIAGNOSIS — N390 Urinary tract infection, site not specified: Secondary | ICD-10-CM | POA: Diagnosis not present

## 2024-05-10 DIAGNOSIS — Z803 Family history of malignant neoplasm of breast: Secondary | ICD-10-CM | POA: Diagnosis not present

## 2024-05-11 DIAGNOSIS — H00012 Hordeolum externum right lower eyelid: Secondary | ICD-10-CM | POA: Diagnosis not present

## 2024-06-08 DIAGNOSIS — B880 Other acariasis: Secondary | ICD-10-CM | POA: Diagnosis not present

## 2024-06-08 DIAGNOSIS — H16223 Keratoconjunctivitis sicca, not specified as Sjogren's, bilateral: Secondary | ICD-10-CM | POA: Diagnosis not present

## 2024-06-08 DIAGNOSIS — H0012 Chalazion right lower eyelid: Secondary | ICD-10-CM | POA: Diagnosis not present

## 2024-06-08 DIAGNOSIS — H01009 Unspecified blepharitis unspecified eye, unspecified eyelid: Secondary | ICD-10-CM | POA: Diagnosis not present

## 2024-06-14 DIAGNOSIS — H0012 Chalazion right lower eyelid: Secondary | ICD-10-CM | POA: Diagnosis not present

## 2024-07-05 DIAGNOSIS — L821 Other seborrheic keratosis: Secondary | ICD-10-CM | POA: Diagnosis not present

## 2024-07-05 DIAGNOSIS — D2262 Melanocytic nevi of left upper limb, including shoulder: Secondary | ICD-10-CM | POA: Diagnosis not present

## 2024-07-05 DIAGNOSIS — D2261 Melanocytic nevi of right upper limb, including shoulder: Secondary | ICD-10-CM | POA: Diagnosis not present

## 2024-07-05 DIAGNOSIS — L570 Actinic keratosis: Secondary | ICD-10-CM | POA: Diagnosis not present

## 2024-07-05 DIAGNOSIS — Z85828 Personal history of other malignant neoplasm of skin: Secondary | ICD-10-CM | POA: Diagnosis not present

## 2024-07-21 DIAGNOSIS — E7800 Pure hypercholesterolemia, unspecified: Secondary | ICD-10-CM | POA: Diagnosis not present

## 2024-07-21 DIAGNOSIS — K580 Irritable bowel syndrome with diarrhea: Secondary | ICD-10-CM | POA: Diagnosis not present

## 2024-07-21 DIAGNOSIS — R7303 Prediabetes: Secondary | ICD-10-CM | POA: Diagnosis not present

## 2024-07-21 DIAGNOSIS — Z780 Asymptomatic menopausal state: Secondary | ICD-10-CM | POA: Diagnosis not present

## 2024-07-21 DIAGNOSIS — G35D Multiple sclerosis, unspecified: Secondary | ICD-10-CM | POA: Diagnosis not present

## 2024-07-21 DIAGNOSIS — Z0000 Encounter for general adult medical examination without abnormal findings: Secondary | ICD-10-CM | POA: Diagnosis not present

## 2024-07-21 DIAGNOSIS — N952 Postmenopausal atrophic vaginitis: Secondary | ICD-10-CM | POA: Diagnosis not present

## 2024-07-21 DIAGNOSIS — Z1321 Encounter for screening for nutritional disorder: Secondary | ICD-10-CM | POA: Diagnosis not present
